# Patient Record
Sex: Female | Born: 1955 | Race: Black or African American | Hispanic: No | Marital: Married | Smoking: Never smoker
Health system: Southern US, Community
[De-identification: ages and names within clinical notes are randomized; demographics above are authoritative.]

## PROBLEM LIST (undated history)

## (undated) DIAGNOSIS — E7800 Pure hypercholesterolemia, unspecified: Secondary | ICD-10-CM

## (undated) DIAGNOSIS — R011 Cardiac murmur, unspecified: Secondary | ICD-10-CM

## (undated) DIAGNOSIS — K219 Gastro-esophageal reflux disease without esophagitis: Secondary | ICD-10-CM

## (undated) DIAGNOSIS — I499 Cardiac arrhythmia, unspecified: Secondary | ICD-10-CM

## (undated) DIAGNOSIS — I10 Essential (primary) hypertension: Secondary | ICD-10-CM

## (undated) DIAGNOSIS — Z8669 Personal history of other diseases of the nervous system and sense organs: Secondary | ICD-10-CM

## (undated) DIAGNOSIS — M549 Dorsalgia, unspecified: Secondary | ICD-10-CM

---

## 1991-07-24 HISTORY — PX: ABDOMINAL HYSTERECTOMY: SHX81

## 2001-02-24 ENCOUNTER — Encounter: Payer: Self-pay | Admitting: Emergency Medicine

## 2001-02-24 ENCOUNTER — Emergency Department (HOSPITAL_COMMUNITY): Admission: EM | Admit: 2001-02-24 | Discharge: 2001-02-24 | Payer: Self-pay | Admitting: Emergency Medicine

## 2004-05-07 ENCOUNTER — Encounter: Admission: RE | Admit: 2004-05-07 | Discharge: 2004-05-07 | Payer: Self-pay | Admitting: Internal Medicine

## 2004-11-14 ENCOUNTER — Emergency Department (HOSPITAL_COMMUNITY): Admission: EM | Admit: 2004-11-14 | Discharge: 2004-11-14 | Payer: Self-pay | Admitting: Emergency Medicine

## 2006-08-23 ENCOUNTER — Encounter: Admission: RE | Admit: 2006-08-23 | Discharge: 2006-08-23 | Payer: Self-pay | Admitting: Sports Medicine

## 2007-07-10 ENCOUNTER — Emergency Department (HOSPITAL_COMMUNITY): Admission: EM | Admit: 2007-07-10 | Discharge: 2007-07-10 | Payer: Self-pay | Admitting: Emergency Medicine

## 2007-07-24 HISTORY — PX: BACK SURGERY: SHX140

## 2007-10-30 ENCOUNTER — Ambulatory Visit (HOSPITAL_COMMUNITY): Admission: RE | Admit: 2007-10-30 | Discharge: 2007-10-31 | Payer: Self-pay | Admitting: Specialist

## 2010-08-14 ENCOUNTER — Encounter: Payer: Self-pay | Admitting: Specialist

## 2010-12-05 NOTE — Op Note (Signed)
Martha Sherman, Martha Sherman             ACCOUNT NO.:  [id number]   MEDICAL RECORD NO.:  [id number]          PATIENT TYPE:  AMB   LOCATION:  DAY                          FACILITY:  [facility]   PHYSICIAN:  Jene Every, M.D.    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   POSTOPERATIVE DIAGNOSES:  Spinal stenosis, herniated nucleus pulposus at  L4-5, L5-S1 less, left.   PROCEDURE PERFORMED:  1. Lateral recess decompression, microdiskectomy 4-5, foraminotomy,      L5.  2. Lateral recess decompression, microdiskectomy and foraminotomy, L5-      S1, left.   ANESTHESIA:  General.   ASSISTANT:  Roma Schanz, P.A.   BRIEF HISTORY AND INDICATION:  This is a 55 year old female with  refractory left lower extremity radicular pain secondary to  predominantly an HNP at 4-5, though she had S1 symptoms as well and  there was lateral recess stenosis and central disk protrusion at 5-1.  EHL weakness was noted.  She had refractory to conservative treatment  including rest, activity modification, corticosteroid injections, anti-  inflammatories, etc.  She was indicated for decompression at 4-5 and at  5-1.  The risks and benefits were discussed including bleeding,  infection, damage to vascular structures, CSF leakage, epidural  fibrosis, adjacent segment disease, need for fusion in the future,  anesthetic complications etc.   The patient was placed in supine position.  After induction of adequate  general anesthesia, 2 grams of Kefzol, she was placed prone on the  Fossil frame.  All bony prominences were well-padded.  Lumbar region  was prepped and draped in the usual sterile fashion.  Two 18 gauge  spinal needles were utilized to localize 4-5 and 5-1 interspace.  This  was confirmed with x-ray.  Incision was made  from spinous process of 4  to the below to the tip of the top of S1.  Subcutaneous tissue was  dissected.  Electrocautery was utilized to achieve hemostasis.  Dorsolumbar fascia was identified and divided in line with the skin  incision.  The paraspinous muscle elevated from the lamina of 4-5 and 5-  1.  The McCullough retractor was placed.  A Penfield 4 in the  interlaminar space confirming 4-5 and 5-1 by x-ray.  Hemilaminotomy of  the caudad edge of 5 was performed with a Leksell rongeur, as well as  the caudad edge of 4.  We then detached the cephalad edge of ligamentum  at 4-5 with the 2 mm Kerrison, preserving the pars.  Straight curette  was utilized to detach ligamentum flavum from the cephalad edge of five.  Ligamentum flavum was removed from the lateral recess.  I preserved a  central flap to be placed over the dural sac following decompression to  limit fibrosis.  Significant stenosis in the lateral recess noted, as  well as a vascular venous plexus.  I performed a foraminotomy of 5,  identified the 5 root and gently mobilized  it medially, exposing a focal  HNP at the disk space slightly migrating caudad.  Annulotomy was  performed and a copious portion of disk material was removed from the  disk space with a straight and upbiting pituitary with multiple passes.  This further mobilized with an Epstein curette.  Again, multiple passes  were performed to ensure full diskectomy of herniated material.  Lateral  recesses had been decompressed in the medial 4 of the pedicle and  bipolar electrocautery was utilized to achieve hemostasis.  Following  this excursion of the nerve root was at least a centimeter medial to the  pedicle without difficulty.  Hockey stick probe passed freely out the  foramen of 4 and 5.  This disk space was copiously irrigated with  Angiocath irrigation and antibiotics.  The ligamentum flavum leaflet was  allowed to flap back over the thecal sac.  Its thickness  was debrided  previously.  In a similar fashion we then detached ligamentum flavum  from the cephalad edge of S1.  There was stenosis noted in the lateral  recess, performed foraminotomy of S1, gently mobilized the thecal sac  and the S1 nerve root medially and decompressed the lateral recess to  the medial border of pedicle, focal HNP was noted.  Annulotomy was  performed.  Disk material was removed from the disk space with straight  and upbiting pituitary, decompressing the lateral recess.  Following  this there was good mobility of the S1 nerve root at least a centimeter  medial to the pedicle without difficulty.  Disk space was copiously  irrigated with antibiotic irrigation.  Inspection revealed no evidence  of CSF leakage or active bleeding.  Thrombin-soaked Gelfoam was placed  in laminotomy defect.  I removed the McCullough retractor.  Paraspinous  muscles were inspected with no evidence of active bleeding.  They were  copiously irrigated and closed the fascia with  #1 Vicryl interrupted  figure-of-eight sutures.  Subcutaneous tissue reapproximated with 2-0  Vicryl simple sutures.  Skin was reapproximate with 4-0 subcuticular  Prolene.  Wound reinforced with Steri-Strips.  Sterile dressing applied.  The patient was placed supine on the hospital bed, extubated without  difficulty and transported to the recovery room in satisfactory  condition.   The patient tolerated the procedure well with no complications.      Jene Every, M.D.  Electronically Signed     JB/MEDQ  D:  10/30/2007  T:  10/30/2007  Job:  [id number]

## 2011-02-01 ENCOUNTER — Emergency Department (HOSPITAL_BASED_OUTPATIENT_CLINIC_OR_DEPARTMENT_OTHER)
Admission: EM | Admit: 2011-02-01 | Discharge: 2011-02-01 | Disposition: A | Discharge status: Home / Self Care | Payer: 59 | Attending: Emergency Medicine | Admitting: Emergency Medicine

## 2011-02-01 ENCOUNTER — Encounter: Payer: Self-pay | Admitting: *Deleted

## 2011-02-01 DIAGNOSIS — I10 Essential (primary) hypertension: Secondary | ICD-10-CM

## 2011-02-01 DIAGNOSIS — E119 Type 2 diabetes mellitus without complications: Secondary | ICD-10-CM | POA: Insufficient documentation

## 2011-02-01 DIAGNOSIS — R51 Headache: Secondary | ICD-10-CM

## 2011-02-01 HISTORY — DX: Essential (primary) hypertension: I10

## 2011-02-01 MED ORDER — DEXAMETHASONE SODIUM PHOSPHATE 10 MG/ML IJ SOLN
10.0000 mg | Freq: Once | INTRAMUSCULAR | Status: AC
  Administered 2011-02-01: 10 mg via INTRAVENOUS
  Filled 2011-02-01: qty 1

## 2011-02-01 MED ORDER — METOCLOPRAMIDE HCL 5 MG/ML IJ SOLN
10.0000 mg | Freq: Once | INTRAMUSCULAR | Status: AC
  Administered 2011-02-01: 10 mg via INTRAVENOUS
  Filled 2011-02-01: qty 2

## 2011-02-01 MED ORDER — SODIUM CHLORIDE 0.9 % IV SOLN
Freq: Once | INTRAVENOUS | Status: AC
  Administered 2011-02-01: 21:00:00 via INTRAVENOUS

## 2011-02-01 NOTE — ED Provider Notes (Signed)
History     Chief Complaint  Patient presents with  . Hypertension   HPI Pt c/o diffuse headache w/ associated photophobia and nausea x approx 6 hours.  No other neurologic complaints.  No fever.  No head trauma. Checked BP and noted it to be elevated w/ systolic in 180s.  Pt has been compliant w/ losartan. Has not had a similar headache in approx 1 year.    Past Medical History  Diagnosis Date  . Hypertension   . Diabetes mellitus     Past Surgical History  Procedure Date  . Abdominal hysterectomy   . Back surgery     History reviewed. No pertinent family history.  History  Substance Use Topics  . Smoking status: Never Smoker   . Smokeless tobacco: Not on file  . Alcohol Use: No    OB History    Grav Para Term Preterm Abortions TAB SAB Ect Mult Living                  Review of Systems  All other systems reviewed and are negative.    Physical Exam  BP 182/88  Pulse 81  Temp(Src) 98.2 F (36.8 C) (Oral)  Resp 16  Wt 178 lb (80.74 kg)  SpO2 100%  Physical Exam  Nursing note and vitals reviewed. Constitutional: She is oriented to person, place, and time. She appears well-developed and well-nourished.       Uncomfortable appearing  HENT:  Head: Normocephalic and atraumatic.  Eyes:       Normal appearance  Neck: Normal range of motion.  Cardiovascular: Normal rate and regular rhythm.   Pulmonary/Chest: Effort normal and breath sounds normal.  Musculoskeletal: Normal range of motion.  Neurological: She is alert and oriented to person, place, and time. She has normal reflexes. No cranial nerve deficit or sensory deficit. Coordination and gait normal.       5/5 and equal upper and lower extremity strength.  No past pointing.  No meningeal signs.  Skin: Skin is warm and dry. No rash noted.  Psychiatric: She has a normal mood and affect. Her behavior is normal.    ED Course  Procedures  MDM Pt presents w/ diffuse, non-traumatic headache and HTN.   Compliant w/ anti-hypertensives.  Afebrile and no focal neuro deficits or meningeal signs on exam.  BP 182/88.   Treated pain w/ IV fluids, decadron and reglan and headache has resolved.  BP rechecked and 169 systolic.  Pt advised to monitor BP at home over the next few days and f/u with PCP.  Return precautions discussed.       Arie Sabina [place], [place] 02/01/11 2138

## 2011-02-01 NOTE — ED Notes (Signed)
Pt c/o h/a x 6 hrs also HTN 183/110 at home

## 2011-02-01 NOTE — ED Notes (Signed)
Pt reports relief of HA

## 2011-04-17 LAB — CBC
HCT: 32 — ABNORMAL LOW
Hemoglobin: 12.6
MCHC: 33.4
MCHC: 34.5
MCV: 80.5
MCV: 80.9
Platelets: 229
RBC: 4.66

## 2011-04-17 LAB — URINALYSIS, ROUTINE W REFLEX MICROSCOPIC
Bilirubin Urine: NEGATIVE
Glucose, UA: NEGATIVE
Hgb urine dipstick: NEGATIVE
Ketones, ur: NEGATIVE
Protein, ur: NEGATIVE

## 2011-04-17 LAB — COMPREHENSIVE METABOLIC PANEL
ALT: 16
CO2: 31
Calcium: 9.5
Creatinine, Ser: 0.67
GFR calc non Af Amer: >60
Glucose, Bld: 170 — ABNORMAL HIGH
Sodium: 140

## 2011-04-17 LAB — DIFFERENTIAL
Basophils Absolute: 0
Eosinophils Absolute: 0.1
Lymphs Abs: 1.4
Neutrophils Relative %: 63

## 2011-04-17 LAB — BASIC METABOLIC PANEL
BUN: 7
BUN: 9
CO2: 24
CO2: 30
Chloride: 103
Chloride: 104
Creatinine, Ser: 0.64
Creatinine, Ser: 0.66
Glucose, Bld: 204 — ABNORMAL HIGH
Potassium: 3.7

## 2011-04-17 LAB — CLOSTRIDIUM DIFFICILE EIA

## 2011-04-17 LAB — PROTIME-INR
INR: 0.9
Prothrombin Time: 12.8

## 2011-04-27 LAB — CBC
HCT: 39.4
MCHC: 32.8
MCV: 81.5
Platelets: 295
RBC: 4.83

## 2011-04-27 LAB — I-STAT 8, (EC8 V) (CONVERTED LAB)
BUN: 6
Bicarbonate: 28.7 — ABNORMAL HIGH
Glucose, Bld: 158 — ABNORMAL HIGH
Hemoglobin: 14.6
Sodium: 139
TCO2: 30
pCO2, Ven: 46.5

## 2011-04-27 LAB — DIFFERENTIAL
Basophils Relative: 1
Eosinophils Absolute: 0.1 — ABNORMAL LOW
Lymphs Abs: 1.2
Monocytes Absolute: 0.3
Neutrophils Relative %: 58

## 2011-04-27 LAB — POCT CARDIAC MARKERS
CKMB, poc: <1 — ABNORMAL LOW
Myoglobin, poc: 64.6
Operator id: 198171
Troponin i, poc: <0.05

## 2011-04-27 LAB — POCT I-STAT CREATININE: Creatinine, Ser: 0.8

## 2011-06-01 NOTE — ED Provider Notes (Signed)
Evaluation and management procedures were performed by the PA/NP under my supervision/collaboration.    Felisa Bonier, MD 06/01/11 1440

## 2012-02-13 ENCOUNTER — Emergency Department (HOSPITAL_BASED_OUTPATIENT_CLINIC_OR_DEPARTMENT_OTHER): Payer: Medicare Other

## 2012-02-13 ENCOUNTER — Emergency Department (HOSPITAL_BASED_OUTPATIENT_CLINIC_OR_DEPARTMENT_OTHER)
Admission: EM | Admit: 2012-02-13 | Discharge: 2012-02-13 | Disposition: A | Discharge status: Home / Self Care | Payer: Medicare Other | Attending: Emergency Medicine | Admitting: Emergency Medicine

## 2012-02-13 ENCOUNTER — Encounter (HOSPITAL_BASED_OUTPATIENT_CLINIC_OR_DEPARTMENT_OTHER): Payer: Self-pay | Admitting: *Deleted

## 2012-02-13 DIAGNOSIS — R51 Headache: Secondary | ICD-10-CM | POA: Insufficient documentation

## 2012-02-13 DIAGNOSIS — R209 Unspecified disturbances of skin sensation: Secondary | ICD-10-CM | POA: Insufficient documentation

## 2012-02-13 DIAGNOSIS — E119 Type 2 diabetes mellitus without complications: Secondary | ICD-10-CM | POA: Insufficient documentation

## 2012-02-13 DIAGNOSIS — Z79899 Other long term (current) drug therapy: Secondary | ICD-10-CM | POA: Insufficient documentation

## 2012-02-13 DIAGNOSIS — I10 Essential (primary) hypertension: Secondary | ICD-10-CM | POA: Insufficient documentation

## 2012-02-13 HISTORY — DX: Cardiac murmur, unspecified: R01.1

## 2012-02-13 HISTORY — DX: Gastro-esophageal reflux disease without esophagitis: K21.9

## 2012-02-13 HISTORY — DX: Cardiac arrhythmia, unspecified: I49.9

## 2012-02-13 LAB — BASIC METABOLIC PANEL
CO2: 30 mEq/L (ref 19–32)
Calcium: 9.6 mg/dL (ref 8.4–10.5)
Creatinine, Ser: 0.7 mg/dL (ref 0.50–1.10)
GFR calc Af Amer: >90 mL/min (ref >90)
GFR calc non Af Amer: >90 mL/min (ref >90)
Sodium: 138 mEq/L (ref 135–145)

## 2012-02-13 LAB — CBC
Hemoglobin: 12.5 g/dL (ref 12.0–15.0)
MCH: 26.5 pg (ref 26.0–34.0)
MCV: 78.8 fL (ref 78.0–100.0)
RBC: 4.72 MIL/uL (ref 3.87–5.11)

## 2012-02-13 MED ORDER — LABETALOL HCL 5 MG/ML IV SOLN
20.0000 mg | Freq: Once | INTRAVENOUS | Status: DC
  Filled 2012-02-13: qty 4

## 2012-02-13 MED ORDER — ACETAMINOPHEN 325 MG PO TABS
650.0000 mg | ORAL_TABLET | Freq: Once | ORAL | Status: AC
  Administered 2012-02-13: 650 mg via ORAL
  Filled 2012-02-13: qty 2

## 2012-02-13 MED ORDER — SODIUM CHLORIDE 0.9 % IV SOLN: INTRAVENOUS | Status: DC

## 2012-02-13 NOTE — ED Notes (Signed)
Patient states she has a headache that has woke her up off and on through out the night.  States she took her bp this morning and it was 195/107.  History of HTN.  States she has had an intermittent nose bleed and her lips and right foot are numb.

## 2012-02-13 NOTE — ED Provider Notes (Signed)
History     CSN: [id number]  Arrival date & time 02/13/12  1308   First MD Initiated Contact with Patient 02/13/12 [phone]      Chief Complaint  Patient presents with  . Headache    (Consider location/radiation/quality/duration/timing/severity/associated sxs/prior treatment) HPI  Patient complaining of headache off and on through night.  She states she get headaches when bp up.  Pain more on left side. No numbness or tingling.  Patient states taking hyzaar as prescribed and takes one pill in a.m.  Took dose at 0730.  No focal weakness or visual problems.  Patient has had headaches before with elevated bp.  Patient states pharmacy sometimes changes her pills ( one generic vs another manufacterer) then she sometimes has problems.  Last change was a month ago.     Past Medical History  Diagnosis Date  . Hypertension   . Diabetes mellitus   . Murmur   . Irregular heart beats     occassional  . Acid reflux     Past Surgical History  Procedure Date  . Abdominal hysterectomy   . Back surgery     No family history on file.  History  Substance Use Topics  . Smoking status: Never Smoker   . Smokeless tobacco: Not on file  . Alcohol Use: No    OB History    Grav Para Term Preterm Abortions TAB SAB Ect Mult Living                  Review of Systems  All other systems reviewed and are negative.    Allergies  Review of patient's allergies indicates no known allergies.  Home Medications   Current Outpatient Rx  Name Route Sig Dispense Refill  . ACETAMINOPHEN 500 MG PO TABS Oral Take 500 mg by mouth as needed. For pain     . HYDROCODONE-ACETAMINOPHEN 5-325 MG PO TABS Oral Take 0.5 tablets by mouth as needed. For pain     . LOSARTAN POTASSIUM-HCTZ 100-25 MG PO TABS Oral Take 1 tablet by mouth daily.      Marland Kitchen METFORMIN HCL ER (MOD) 500 MG PO TB24 Oral Take 500 mg by mouth 2 (two) times daily with a meal.        BP 190/98  Pulse 72  Temp 98.2 F (36.8 C) (Oral)  Resp  20  Ht 5\' 8"  (1.727 m)  Wt 177 lb (80.287 kg)  BMI 26.91 kg/m2  SpO2 100%  Physical Exam  Nursing note and vitals reviewed. Constitutional: She appears well-developed and well-nourished.  HENT:  Head: Normocephalic and atraumatic.  Eyes: Conjunctivae and EOM are normal. Pupils are equal, round, and reactive to light.  Neck: Normal range of motion. Neck supple.  Cardiovascular: Normal rate, regular rhythm, normal heart sounds and intact distal pulses.   Pulmonary/Chest: Effort normal and breath sounds normal.  Abdominal: Soft. Bowel sounds are normal.  Musculoskeletal: Normal range of motion.  Neurological: She is alert.  Skin: Skin is warm and dry.  Psychiatric: She has a normal mood and affect. Thought content normal.    ED Course  Procedures (including critical care time)  Labs Reviewed - No data to display No results found.   No diagnosis found.    MDM  No information on file. Ct Head Wo Contrast  02/13/2012  *RADIOLOGY REPORT*  Clinical Data: Headaches.  Hypertension.  Lipid numbness.  CT HEAD WITHOUT CONTRAST  Technique:  Contiguous axial images were obtained from the base of the skull  through the vertex without contrast.  Comparison: None.  Findings: No acute intracranial abnormality is present. Specifically, there is no evidence for acute infarct, hemorrhage, mass, hydrocephalus, or extra-axial fluid collection.  The paranasal sinuses and mastoid air cells are clear.  The globes and orbits are intact.  The osseous skull is intact.  IMPRESSION: Negative CT of the head.  Original Report Authenticated By: Jamesetta Orleans. MATTERN, M.D.  Results for orders placed during the hospital encounter of 02/13/12  BASIC METABOLIC PANEL      Component Value Range   Sodium 138  135 - 145 mEq/L   Potassium 3.3 (*) 3.5 - 5.1 mEq/L   Chloride 99  96 - 112 mEq/L   CO2 30  19 - 32 mEq/L   Glucose, Bld 206 (*) 70 - 99 mg/dL   BUN 11  6 - 23 mg/dL   Creatinine, Ser 1.61  0.50 - 1.10  mg/dL   Calcium 9.6  8.4 - 09.6 mg/dL   GFR calc non Af Amer >90  >90 mL/min   GFR calc Af Amer >90  >90 mL/min  CBC      Component Value Range   WBC 5.2  4.0 - 10.5 K/uL   RBC 4.72  3.87 - 5.11 MIL/uL   Hemoglobin 12.5  12.0 - 15.0 g/dL   HCT 04.5  40.9 - 81.1 %   MCV 78.8  78.0 - 100.0 fL   MCH 26.5  26.0 - 34.0 pg   MCHC 33.6  30.0 - 36.0 g/dL   RDW 91.4  78.2 - 95.6 %   Platelets 294  150 - 400 K/uL         Date: 02/13/2012  Rate: 59  Rhythm: normal sinus rhythm  QRS Axis: normal  Intervals: normal  ST/T Wave abnormalities: normal  Conduction Disutrbances: none  Narrative Interpretation: unremarkable    Patient with improved headache ans sbp decreased to 178.  Discussed withpatient and she will follow up her pmd for recheck bs possible medication adjustment of bp meds.   Hilario Quarry, MD 02/13/12 1040

## 2014-01-05 ENCOUNTER — Encounter (HOSPITAL_BASED_OUTPATIENT_CLINIC_OR_DEPARTMENT_OTHER): Payer: Self-pay | Admitting: Emergency Medicine

## 2014-01-05 ENCOUNTER — Emergency Department (HOSPITAL_BASED_OUTPATIENT_CLINIC_OR_DEPARTMENT_OTHER): Payer: Medicare Other

## 2014-01-05 ENCOUNTER — Emergency Department (HOSPITAL_BASED_OUTPATIENT_CLINIC_OR_DEPARTMENT_OTHER)
Admission: EM | Admit: 2014-01-05 | Discharge: 2014-01-06 | Disposition: A | Discharge status: Home / Self Care | Payer: Medicare Other | Attending: Emergency Medicine | Admitting: Emergency Medicine

## 2014-01-05 DIAGNOSIS — K219 Gastro-esophageal reflux disease without esophagitis: Secondary | ICD-10-CM | POA: Insufficient documentation

## 2014-01-05 DIAGNOSIS — Z79899 Other long term (current) drug therapy: Secondary | ICD-10-CM | POA: Insufficient documentation

## 2014-01-05 DIAGNOSIS — Z862 Personal history of diseases of the blood and blood-forming organs and certain disorders involving the immune mechanism: Secondary | ICD-10-CM | POA: Insufficient documentation

## 2014-01-05 DIAGNOSIS — R0789 Other chest pain: Secondary | ICD-10-CM | POA: Insufficient documentation

## 2014-01-05 DIAGNOSIS — R011 Cardiac murmur, unspecified: Secondary | ICD-10-CM | POA: Insufficient documentation

## 2014-01-05 DIAGNOSIS — E119 Type 2 diabetes mellitus without complications: Secondary | ICD-10-CM | POA: Insufficient documentation

## 2014-01-05 DIAGNOSIS — Z8639 Personal history of other endocrine, nutritional and metabolic disease: Secondary | ICD-10-CM | POA: Insufficient documentation

## 2014-01-05 DIAGNOSIS — I10 Essential (primary) hypertension: Secondary | ICD-10-CM | POA: Insufficient documentation

## 2014-01-05 HISTORY — DX: Pure hypercholesterolemia, unspecified: E78.00

## 2014-01-05 LAB — BASIC METABOLIC PANEL
BUN: 10 mg/dL (ref 6–23)
CHLORIDE: 102 meq/L (ref 96–112)
CO2: 26 mEq/L (ref 19–32)
Calcium: 9.8 mg/dL (ref 8.4–10.5)
Creatinine, Ser: 0.7 mg/dL (ref 0.50–1.10)
GLUCOSE: 169 mg/dL — AB (ref 70–99)
POTASSIUM: 3.6 meq/L — AB (ref 3.7–5.3)
SODIUM: 142 meq/L (ref 137–147)

## 2014-01-05 LAB — CBC WITH DIFFERENTIAL/PLATELET
BASOS PCT: 0 % (ref 0–1)
Basophils Absolute: 0 10*3/uL (ref 0.0–0.1)
EOS ABS: 0.1 10*3/uL (ref 0.0–0.7)
Eosinophils Relative: 2 % (ref 0–5)
HCT: 37.7 % (ref 36.0–46.0)
HEMOGLOBIN: 12.6 g/dL (ref 12.0–15.0)
LYMPHS ABS: 3 10*3/uL (ref 0.7–4.0)
Lymphocytes Relative: 44 % (ref 12–46)
MCH: 27.5 pg (ref 26.0–34.0)
MCHC: 33.4 g/dL (ref 30.0–36.0)
MCV: 82.1 fL (ref 78.0–100.0)
MONOS PCT: 9 % (ref 3–12)
Monocytes Absolute: 0.6 10*3/uL (ref 0.1–1.0)
NEUTROS ABS: 3.1 10*3/uL (ref 1.7–7.7)
NEUTROS PCT: 45 % (ref 43–77)
PLATELETS: 245 10*3/uL (ref 150–400)
RBC: 4.59 MIL/uL (ref 3.87–5.11)
RDW: 14.3 % (ref 11.5–15.5)
WBC: 6.8 10*3/uL (ref 4.0–10.5)

## 2014-01-05 LAB — TROPONIN I: Troponin I: <0.3 ng/mL (ref <0.30)

## 2014-01-05 NOTE — ED Provider Notes (Signed)
CSN: [id number]     Arrival date & time 01/05/14  2217 History  This chart was scribed for Hanley SeamenJohn L Molpus, MD by Ardelia Memsylan Malpass, ED Scribe. This patient was seen in room MH01/MH01 and the patient's care was started at 11:10 PM.   Chief Complaint  Patient presents with  . Chest Pain    The history is provided by the patient. No language interpreter was used.    HPI Comments: Martha Sherman is a 58 y.o. female with a history of HTN, DM, high cholesterol and heart murmur who presents to the Emergency Department complaining of intermittent, sharp substernal chest pain that began around 9:30 PM tonight. She states that her pain does not radiate. She states that there are no inciting factors that bring about her pain. She states that her pain is not modified by breathing. She states that she has taken Aspirin with some relief of her pain. She states that the pain occurs in 15-20 second episodes, which occur about every 5 minutes. She states that she is not having any chest pain currently. She states that she has mild SOB while she is having chest pain. She denies any associated diaphoresis or nausea.   Past Medical History  Diagnosis Date  . Hypertension   . Diabetes mellitus   . Murmur   . Irregular heart beats     occassional  . Acid reflux   . High cholesterol    Past Surgical History  Procedure Laterality Date  . Abdominal hysterectomy    . Back surgery     No family history on file. History  Substance Use Topics  . Smoking status: Never Smoker   . Smokeless tobacco: Not on file  . Alcohol Use: No   OB History   Grav Para Term Preterm Abortions TAB SAB Ect Mult Living                 Review of Systems A complete 10 system review of systems was obtained and all systems are negative except as noted in the HPI and PMH.   Allergies  Review of patient's allergies indicates no known allergies.  Home Medications   Prior to Admission medications   Medication Sig Start Date End  Date Taking? Authorizing Provider  OMEPRAZOLE PO Take by mouth.   Yes Historical Provider, MD  acetaminophen (TYLENOL) 500 MG tablet Take 500 mg by mouth as needed. For pain     Historical Provider, MD  HYDROcodone-acetaminophen (NORCO) 5-325 MG per tablet Take 0.5 tablets by mouth as needed. For pain     Historical Provider, MD  losartan-hydrochlorothiazide (HYZAAR) 100-25 MG per tablet Take 1 tablet by mouth daily.      Historical Provider, MD  metFORMIN (GLUMETZA) 500 MG (MOD) 24 hr tablet Take 500 mg by mouth 2 (two) times daily with a meal.      Historical Provider, MD   Triage Vitals: BP 222/98  Pulse 89  Temp(Src) 98.3 F (36.8 C) (Oral)  Resp 20  Ht 5\' 7"  (1.702 m)  Wt 180 lb (81.647 kg)  BMI 28.19 kg/m2  SpO2 100%  Physical Exam  Nursing note and vitals reviewed. General: Well-developed, well-nourished female in no acute distress; appearance consistent with age of record HENT: normocephalic; atraumatic Eyes: pupils equal, round and reactive to light; extraocular muscles intact Neck: supple Heart: regular rate and rhythm; no murmur heard Lungs: clear to auscultation bilaterally Abdomen: soft; nondistended; nontender; no masses or hepatosplenomegaly; bowel sounds present Extremities: No deformity; full  range of motion; pulses normal; no edema Neurologic: Awake, alert and oriented; motor function intact in all extremities and symmetric; no facial droop Skin: Warm and dry Psychiatric: Normal mood and affect  ED Course  Procedures (including critical care time)  DIAGNOSTIC STUDIES: Oxygen Saturation is 100% on RA, normal by my interpretation.    COORDINATION OF CARE: 11:15 PM- Pt advised of plan for treatment and pt agrees.   EKG Interpretation   Date/Time:  Tuesday January 05 2014 22:25:37 EDT Ventricular Rate:  80 PR Interval:  134 QRS Duration: 100 QT Interval:  374 QTC Calculation: 431 R Axis:   40 Text Interpretation:  Normal sinus rhythm Incomplete right  bundle branch  block , not present on prior Nonspecific ST abnormality Abnormal ECG  Confirmed by HORTON  MD, COURTNEY ([id number]) on 01/05/2014 10:28:33 PM      MDM   Nursing notes and vitals signs, including pulse oximetry, reviewed.  Summary of this visit's results, reviewed by myself:  Labs:  Results for orders placed during the hospital encounter of 01/05/14 (from the past 24 hour(s))  CBC WITH DIFFERENTIAL     Status: None   Collection Time    01/05/14 10:25 PM      Result Value Ref Range   WBC 6.8  4.0 - 10.5 K/uL   RBC 4.59  3.87 - 5.11 MIL/uL   Hemoglobin 12.6  12.0 - 15.0 g/dL   HCT 60.437.7  54.036.0 - 98.146.0 %   MCV 82.1  78.0 - 100.0 fL   MCH 27.5  26.0 - 34.0 pg   MCHC 33.4  30.0 - 36.0 g/dL   RDW 19.114.3  47.811.5 - 29.515.5 %   Platelets 245  150 - 400 K/uL   Neutrophils Relative % 45  43 - 77 %   Neutro Abs 3.1  1.7 - 7.7 K/uL   Lymphocytes Relative 44  12 - 46 %   Lymphs Abs 3.0  0.7 - 4.0 K/uL   Monocytes Relative 9  3 - 12 %   Monocytes Absolute 0.6  0.1 - 1.0 K/uL   Eosinophils Relative 2  0 - 5 %   Eosinophils Absolute 0.1  0.0 - 0.7 K/uL   Basophils Relative 0  0 - 1 %   Basophils Absolute 0.0  0.0 - 0.1 K/uL  BASIC METABOLIC PANEL     Status: Abnormal   Collection Time    01/05/14 10:25 PM      Result Value Ref Range   Sodium 142  137 - 147 mEq/L   Potassium 3.6 (*) 3.7 - 5.3 mEq/L   Chloride 102  96 - 112 mEq/L   CO2 26  19 - 32 mEq/L   Glucose, Bld 169 (*) 70 - 99 mg/dL   BUN 10  6 - 23 mg/dL   Creatinine, Ser 6.210.70  0.50 - 1.10 mg/dL   Calcium 9.8  8.4 - 30.810.5 mg/dL   GFR calc non Af Amer >90  >90 mL/min   GFR calc Af Amer >90  >90 mL/min  TROPONIN I     Status: None   Collection Time    01/05/14 10:25 PM      Result Value Ref Range   Troponin I <0.30  <0.30 ng/mL  TROPONIN I     Status: None   Collection Time    01/06/14 12:15 AM      Result Value Ref Range   Troponin I <0.30  <0.30 ng/mL  TROPONIN I  Status: None   Collection Time    01/06/14  1:30  AM      Result Value Ref Range   Troponin I <0.30  <0.30 ng/mL    Imaging Studies: Dg Chest 2 View  01/05/2014   CLINICAL DATA:  Mid chest pain.  EXAM: CHEST  2 VIEW  COMPARISON:  Chest radiograph from 07/10/2007  FINDINGS: The lungs are well-aerated. Pulmonary vascularity is at the upper limits of normal. There is no evidence of focal opacification, pleural effusion or pneumothorax.  The heart is borderline normal in size; the mediastinal contour is within normal limits. No acute osseous abnormalities are seen.  IMPRESSION: No acute cardiopulmonary process seen.   Electronically Signed   By: Roanna Raider M.D.   On: 01/05/2014 23:11   2:05 AM The patient has been asymptomatic since arrival. She had 3 negative troponins. Her symptoms are atypical for coronary pain. We will have her followup with her primary care physician.  I personally performed the services described in this documentation, which was scribed in my presence. The recorded information has been reviewed and is accurate.   Hanley Seamen, MD 01/06/14 [phone]

## 2014-01-05 NOTE — ED Notes (Addendum)
C/o intermittent CP x 3 days-denies pain at present-pt took ASA 325mg  PTA

## 2014-01-06 LAB — TROPONIN I

## 2014-01-06 NOTE — Discharge Instructions (Signed)

## 2014-02-19 ENCOUNTER — Encounter: Payer: Self-pay | Admitting: Podiatry

## 2014-02-19 ENCOUNTER — Ambulatory Visit (INDEPENDENT_AMBULATORY_CARE_PROVIDER_SITE_OTHER): Payer: Medicare Other | Admitting: Podiatry

## 2014-02-19 VITALS — BP 162/84 | HR 85 | Ht 67.0 in | Wt 177.0 lb

## 2014-02-19 DIAGNOSIS — M79609 Pain in unspecified limb: Secondary | ICD-10-CM | POA: Diagnosis not present

## 2014-02-19 DIAGNOSIS — M79671 Pain in right foot: Secondary | ICD-10-CM

## 2014-02-19 DIAGNOSIS — M722 Plantar fascial fibromatosis: Secondary | ICD-10-CM

## 2014-02-19 DIAGNOSIS — M21969 Unspecified acquired deformity of unspecified lower leg: Secondary | ICD-10-CM | POA: Diagnosis not present

## 2014-02-19 DIAGNOSIS — M21961 Unspecified acquired deformity of right lower leg: Secondary | ICD-10-CM

## 2014-02-19 NOTE — Patient Instructions (Signed)
Seen for right foot and heel pain.  Noted of weakened first Metatarsocuneiform joint right.  Night Splint dispensed x 1. Will use Cortisone injection as needed.  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  Mean time use laced up tennis shoes.

## 2014-02-19 NOTE — Progress Notes (Signed)
Pain in right heel x 63month. Had a fall one month ago and hurt right foot while running away from a lizard. Taken X-ray noted of no bone injury.  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 Has minor blurring vision. ENT ROS: negative Allergy and Immunology ROS: negative Breast ROS: Had done biopsy of breast and result was negative.  Respiratory ROS: no cough, shortness of breath, or wheezing Cardiovascular ROS: Has heart murmur. Dagnosed with mild pre hypertension in heart.  Gastrointestinal ROS: Takes medication for acid reflux. Genito-Urinary ROS: no dysuria, trouble voiding, or hematuria Musculoskeletal ROS: Herniated degenerative disc problem L4-5. Neurological ROS: Right sciatic nerve problem affecting right hip through the foot.  Dermatological ROS: negative.  Objective: Dermatologic: Normal findings. Vascular: All pedal pulses are palpable. No edema or erythema noted. Neurologic: All epicritic and tactile sensations grossly intact. 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 Enlarged tendon sheet Posterior tibial tendon behind medial malleolus.  Right heel pain in the morning.   Assessment: Plantar fasciitis right. Hypermobile first ray R>L.   Plan Clinical findings and available treatment options reviewed.  Night Splint dispensed x 1. Will use Cortisone injection as needed.  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  Mean time use laced up tennis shoes.

## 2014-12-25 ENCOUNTER — Emergency Department (HOSPITAL_BASED_OUTPATIENT_CLINIC_OR_DEPARTMENT_OTHER)
Admission: EM | Admit: 2014-12-25 | Discharge: 2014-12-25 | Disposition: A | Discharge status: Home / Self Care | Payer: Medicare Other | Attending: Emergency Medicine | Admitting: Emergency Medicine

## 2014-12-25 ENCOUNTER — Emergency Department (HOSPITAL_BASED_OUTPATIENT_CLINIC_OR_DEPARTMENT_OTHER): Payer: Medicare Other

## 2014-12-25 ENCOUNTER — Encounter (HOSPITAL_BASED_OUTPATIENT_CLINIC_OR_DEPARTMENT_OTHER): Payer: Self-pay | Admitting: *Deleted

## 2014-12-25 DIAGNOSIS — Z79899 Other long term (current) drug therapy: Secondary | ICD-10-CM | POA: Insufficient documentation

## 2014-12-25 DIAGNOSIS — R011 Cardiac murmur, unspecified: Secondary | ICD-10-CM | POA: Diagnosis not present

## 2014-12-25 DIAGNOSIS — J209 Acute bronchitis, unspecified: Secondary | ICD-10-CM | POA: Insufficient documentation

## 2014-12-25 DIAGNOSIS — J40 Bronchitis, not specified as acute or chronic: Secondary | ICD-10-CM

## 2014-12-25 DIAGNOSIS — E119 Type 2 diabetes mellitus without complications: Secondary | ICD-10-CM | POA: Insufficient documentation

## 2014-12-25 DIAGNOSIS — K219 Gastro-esophageal reflux disease without esophagitis: Secondary | ICD-10-CM | POA: Insufficient documentation

## 2014-12-25 DIAGNOSIS — I10 Essential (primary) hypertension: Secondary | ICD-10-CM | POA: Diagnosis not present

## 2014-12-25 DIAGNOSIS — R05 Cough: Secondary | ICD-10-CM | POA: Diagnosis present

## 2014-12-25 DIAGNOSIS — R0602 Shortness of breath: Secondary | ICD-10-CM

## 2014-12-25 HISTORY — DX: Dorsalgia, unspecified: M54.9

## 2014-12-25 LAB — COMPREHENSIVE METABOLIC PANEL
ALBUMIN: 3.8 g/dL (ref 3.5–5.0)
ALK PHOS: 69 U/L (ref 38–126)
ALT: 13 U/L — AB (ref 14–54)
AST: 17 U/L (ref 15–41)
Anion gap: 8 (ref 5–15)
BILIRUBIN TOTAL: 0.3 mg/dL (ref 0.3–1.2)
BUN: 8 mg/dL (ref 6–20)
CALCIUM: 8.9 mg/dL (ref 8.9–10.3)
CO2: 26 mmol/L (ref 22–32)
Chloride: 103 mmol/L (ref 101–111)
Creatinine, Ser: 0.65 mg/dL (ref 0.44–1.00)
GFR calc Af Amer: >60 mL/min (ref >60)
GFR calc non Af Amer: >60 mL/min (ref >60)
Glucose, Bld: 236 mg/dL — ABNORMAL HIGH (ref 65–99)
Potassium: 3.7 mmol/L (ref 3.5–5.1)
Sodium: 137 mmol/L (ref 135–145)
TOTAL PROTEIN: 7 g/dL (ref 6.5–8.1)

## 2014-12-25 LAB — BRAIN NATRIURETIC PEPTIDE: B NATRIURETIC PEPTIDE 5: 35.4 pg/mL (ref 0.0–100.0)

## 2014-12-25 LAB — CBC WITH DIFFERENTIAL/PLATELET
BASOS ABS: 0 10*3/uL (ref 0.0–0.1)
Basophils Relative: 0 % (ref 0–1)
EOS ABS: 0.1 10*3/uL (ref 0.0–0.7)
Eosinophils Relative: 2 % (ref 0–5)
HEMATOCRIT: 36.9 % (ref 36.0–46.0)
Hemoglobin: 11.7 g/dL — ABNORMAL LOW (ref 12.0–15.0)
LYMPHS ABS: 1.5 10*3/uL (ref 0.7–4.0)
LYMPHS PCT: 33 % (ref 12–46)
MCH: 25.9 pg — ABNORMAL LOW (ref 26.0–34.0)
MCHC: 31.7 g/dL (ref 30.0–36.0)
MCV: 81.6 fL (ref 78.0–100.0)
MONO ABS: 0.3 10*3/uL (ref 0.1–1.0)
Monocytes Relative: 8 % (ref 3–12)
NEUTROS PCT: 57 % (ref 43–77)
Neutro Abs: 2.5 10*3/uL (ref 1.7–7.7)
Platelets: 245 10*3/uL (ref 150–400)
RBC: 4.52 MIL/uL (ref 3.87–5.11)
RDW: 14.6 % (ref 11.5–15.5)
WBC: 4.5 10*3/uL (ref 4.0–10.5)

## 2014-12-25 LAB — TROPONIN I

## 2014-12-25 LAB — D-DIMER, QUANTITATIVE (NOT AT ARMC)

## 2014-12-25 MED ORDER — ALBUTEROL SULFATE HFA 108 (90 BASE) MCG/ACT IN AERS
2.0000 | INHALATION_SPRAY | RESPIRATORY_TRACT | Status: DC | PRN
  Administered 2014-12-25: 2 via RESPIRATORY_TRACT
  Filled 2014-12-25: qty 6.7

## 2014-12-25 MED ORDER — AEROCHAMBER PLUS FLO-VU MEDIUM MISC
1.0000 | Freq: Once | Status: AC
  Administered 2014-12-25: 1
  Filled 2014-12-25: qty 1

## 2014-12-25 MED ORDER — BENZONATATE 100 MG PO CAPS: 100.0000 mg | ORAL_CAPSULE | Freq: Three times a day (TID) | ORAL | Status: DC

## 2014-12-25 MED ORDER — AZITHROMYCIN 250 MG PO TABS: 250.0000 mg | ORAL_TABLET | Freq: Every day | ORAL | Status: DC

## 2014-12-25 NOTE — ED Notes (Signed)
Patient transported to X-ray 

## 2014-12-25 NOTE — ED Notes (Signed)
MD at bedside. 

## 2014-12-25 NOTE — ED Notes (Signed)
Pt reports SOB and cough x 2 weeks- eval by PCP for same

## 2014-12-25 NOTE — Discharge Instructions (Signed)

## 2014-12-25 NOTE — ED Notes (Signed)
D/c home with rx x 2 for tessalon and zithromax- albuterol inhaler given by RT with instructions for use

## 2014-12-25 NOTE — ED Provider Notes (Signed)
CSN: [id number]     Arrival date & time 12/25/14  0912 History   First MD Initiated Contact with Patient 12/25/14 0919     Chief Complaint  Patient presents with  . Cough  . Wheezing     (Consider location/radiation/quality/duration/timing/severity/associated sxs/prior Treatment) HPI Comments: Patient presents to the ER for evaluation of difficulty breathing. Patient reports that she started having cough and chest congestion 2 or 3 weeks ago. She saw her primary doctor and had an x-ray that was negative. She reports that she has continued to have shortness of breath. She reports that last night her breathing worsened, she felt like she was wheezing and had to sit up in order to breathe better. She has not been experiencing any chest pain associated with the symptoms. She has not had any fever.  Patient is a 59 y.o. female presenting with cough and wheezing.  Cough Associated symptoms: shortness of breath and wheezing   Associated symptoms: no chest pain   Wheezing Associated symptoms: cough and shortness of breath   Associated symptoms: no chest pain     Past Medical History  Diagnosis Date  . Hypertension   . Diabetes mellitus   . Murmur   . Irregular heart beats     occassional  . Acid reflux   . High cholesterol   . Back pain    Past Surgical History  Procedure Laterality Date  . Abdominal hysterectomy    . Back surgery     No family history on file. History  Substance Use Topics  . Smoking status: Never Smoker   . Smokeless tobacco: Never Used  . Alcohol Use: No   OB History    No data available     Review of Systems  Respiratory: Positive for cough, shortness of breath and wheezing.   Cardiovascular: Negative for chest pain and leg swelling.  All other systems reviewed and are negative.     Allergies  Review of patient's allergies indicates no known allergies.  Home Medications   Prior to Admission medications   Medication Sig Start Date End Date  Taking? Authorizing Provider  acetaminophen (TYLENOL) 500 MG tablet Take 500 mg by mouth as needed. For pain    Yes Historical Provider, MD  amLODipine (NORVASC) 10 MG tablet Take 10 mg by mouth daily.   Yes Historical Provider, MD  HYDROcodone-acetaminophen (NORCO) 5-325 MG per tablet Take 0.5 tablets by mouth as needed. For pain    Yes Historical Provider, MD  losartan-hydrochlorothiazide (HYZAAR) 100-25 MG per tablet Take 1 tablet by mouth daily.     Yes Historical Provider, MD  metFORMIN (GLUMETZA) 500 MG (MOD) 24 hr tablet Take 500 mg by mouth 2 (two) times daily with a meal.     Yes Historical Provider, MD  OMEPRAZOLE PO Take by mouth.   Yes Historical Provider, MD  azithromycin (ZITHROMAX) 250 MG tablet Take 1 tablet (250 mg total) by mouth daily. Take first 2 tablets together, then 1 every day until finished. 12/25/14   Gilda Crease, MD  benzonatate (TESSALON) 100 MG capsule Take 1 capsule (100 mg total) by mouth every 8 (eight) hours. 12/25/14   Gilda Crease, MD   BP 188/86 mmHg  Pulse 62  Temp(Src) 97.7 F (36.5 C) (Oral)  Resp 20  Ht  (1.702 m)  Wt 180 lb (81.647 kg)  BMI 28.19 kg/m2  SpO2 100% Physical Exam  Constitutional: She is oriented to person, place, and time. She appears well-developed and  well-nourished. No distress.  HENT:  Head: Normocephalic and atraumatic.  Right Ear: Hearing normal.  Left Ear: Hearing normal.  Nose: Nose normal.  Mouth/Throat: Oropharynx is clear and moist and mucous membranes are normal.  Eyes: Conjunctivae and EOM are normal. Pupils are equal, round, and reactive to light.  Neck: Normal range of motion. Neck supple.  Cardiovascular: Regular rhythm, S1 normal and S2 normal.  Exam reveals no gallop and no friction rub.   No murmur heard. Pulmonary/Chest: Effort normal and breath sounds normal. No respiratory distress. She exhibits no tenderness.  Abdominal: Soft. Normal appearance and bowel sounds are normal. There is no  hepatosplenomegaly. There is no tenderness. There is no rebound, no guarding, no tenderness at McBurney's point and negative Murphy's sign. No hernia.  Musculoskeletal: Normal range of motion.  Neurological: She is alert and oriented to person, place, and time. She has normal strength. No cranial nerve deficit or sensory deficit. Coordination normal. GCS eye subscore is 4. GCS verbal subscore is 5. GCS motor subscore is 6.  Skin: Skin is warm, dry and intact. No rash noted. No cyanosis.  Psychiatric: She has a normal mood and affect. Her speech is normal and behavior is normal. Thought content normal.  Nursing note and vitals reviewed.   ED Course  Procedures (including critical care time) Labs Review Labs Reviewed  CBC WITH DIFFERENTIAL/PLATELET - Abnormal; Notable for the following:    Hemoglobin 11.7 (*)    MCH 25.9 (*)    All other components within normal limits  COMPREHENSIVE METABOLIC PANEL - Abnormal; Notable for the following:    Glucose, Bld 236 (*)    ALT 13 (*)    All other components within normal limits  TROPONIN I  BRAIN NATRIURETIC PEPTIDE  D-DIMER, QUANTITATIVE (NOT AT [facility])    Imaging Review Dg Chest 2 View  12/25/2014   CLINICAL DATA:  Shortness of breath for 2 days.  Cough.  EXAM: CHEST - 2 VIEW  COMPARISON:  Two-view chest 01/05/2014.  FINDINGS: The heart size and mediastinal contours are within normal limits. Both lungs are clear. The visualized skeletal structures are unremarkable.  IMPRESSION: Negative two view chest x-ray.   Electronically Signed   By: Marin Robertshristopher  Mattern M.D.   On: 12/25/2014 09:50     EKG Interpretation   Date/Time:  Saturday December 25 2014 09:32:12 EDT Ventricular Rate:  62 PR Interval:  142 QRS Duration: 96 QT Interval:  416 QTC Calculation: 422 R Axis:   66 Text Interpretation:  Normal sinus rhythm Nonspecific ST abnormality  Abnormal ECG No significant change since last tracing Confirmed by Kylyn Sookram   MD, Kessler Solly [phone]([id number]) on  12/25/2014 9:52:25 AM      MDM   Final diagnoses:  Shortness of breath  Bronchitis    Patient presents with complaints of cough, chest congestion, shortness of breath. At times have been ongoing for more than 2 weeks. A chest x-ray was performed by her primary doctor but was negative. She describes worsening symptoms when she lies down at night. She has felt like she has had wheezing and chest congestion. Workup does not suggest congestive heart failure. Chest x-ray was clear. She has a normal d-dimer. Symptoms do not suggest PE. Patient having persistent symptoms of cough and chest congestion, will treat with Zithromax, Tessalon Perles, albuterol.    Gilda Creasehristopher J Ailynn Gow, MD 12/25/14 1128

## 2015-01-18 DIAGNOSIS — R05 Cough: Secondary | ICD-10-CM | POA: Insufficient documentation

## 2015-01-18 DIAGNOSIS — R0602 Shortness of breath: Secondary | ICD-10-CM | POA: Insufficient documentation

## 2015-01-18 DIAGNOSIS — R059 Cough, unspecified: Secondary | ICD-10-CM | POA: Insufficient documentation

## 2015-01-19 DIAGNOSIS — I10 Essential (primary) hypertension: Secondary | ICD-10-CM | POA: Insufficient documentation

## 2015-01-19 DIAGNOSIS — E119 Type 2 diabetes mellitus without complications: Secondary | ICD-10-CM | POA: Insufficient documentation

## 2015-01-19 DIAGNOSIS — M79601 Pain in right arm: Secondary | ICD-10-CM | POA: Insufficient documentation

## 2015-07-04 ENCOUNTER — Encounter: Payer: Self-pay | Admitting: Neurology

## 2015-07-04 ENCOUNTER — Ambulatory Visit (INDEPENDENT_AMBULATORY_CARE_PROVIDER_SITE_OTHER): Payer: Medicare Other | Admitting: Neurology

## 2015-07-04 VITALS — BP 162/92 | HR 84 | Ht 67.5 in | Wt 186.6 lb

## 2015-07-04 DIAGNOSIS — R42 Dizziness and giddiness: Secondary | ICD-10-CM | POA: Diagnosis not present

## 2015-07-04 DIAGNOSIS — G4484 Primary exertional headache: Secondary | ICD-10-CM

## 2015-07-04 DIAGNOSIS — R51 Headache: Secondary | ICD-10-CM

## 2015-07-04 DIAGNOSIS — G935 Compression of brain: Secondary | ICD-10-CM

## 2015-07-04 DIAGNOSIS — R29898 Other symptoms and signs involving the musculoskeletal system: Secondary | ICD-10-CM

## 2015-07-04 NOTE — Progress Notes (Signed)
[facility]    Provider:  Dr Lucia Gaskins Referring Provider: Karle Plumber, MD Primary Care Physician:  Caffie Damme, MD  CC:  Chiari Malformation  HPI:  Martha Sherman is a 59 y.o. female here as a referral from Dr. Kathrynn Sherman and Dr. Shon Sherman for neck pain, Chiari Malformation found on MRI of the cervical spine. She has neck pain, sharp and stabbing pain and stiffness in the cervical muscles and some cracking in the neck as she turns. Occasional headaches. She has bad headaches, a lot of pressure in the head. Lasts for a day and is responsive to OTC meds. The pressure is behind the eyes with light sensitivity and nausea once a month. The back of her neck feels pain with the headaches. She also has pain in the right arm and pain in the thumb with swelling. She wakes up with tingling in the right fingers. Weak grip in the right hand, difficulty opening jars. The pain is in the deltoids as well like a "jittering" inside.  No vision problems, no double vision or cranial neuropathies such as dysarthria or dysphagia, every now and ten she feels dizzy and the room spins and she feels off balance but that is episodic and then resolves, no other weakness.  She has increased neck pain on straining and coughing. Even if she laughs really hard she can experience neck pain.   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 Bladder he did subarachnoid space at the foramen magnum. 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 No congenital osseous abnormalities. Preference to prior lumbar MRI  demonstrates of the conus terminates properly. No intramedullary or intradural pathology cord visualized.  CBC with mild anemia  CMP with elevated glucose otherwise unremarkable  Review of Systems: Patient complains of symptoms per HPI as well as the following symptoms: No chest pain and no shortness of breath. Pertinent negatives per HPI. All others negative.   Social History   Social History  . Marital Status: Married    Spouse Name: [name]  . Number of Children: 1  . Years of Education: 14   Occupational History  . retired     Surveyor, [employer], [employer]   Social History Main Topics  . Smoking status: Never Smoker   . Smokeless tobacco: Never Used  . Alcohol Use: No  . Drug Use: No  . Sexual Activity: Yes    Birth Control/ Protection: Surgical   Other Topics Concern  . Not on file   Social History Narrative   Lives at home with husband   Caffeine use- sodas, 3 a day    Family History  Problem Relation Age of Onset  . Heart attack Mother   . Cancer Father   . Alzheimer's disease Father   . Hypertension Sister   . Hypertension Brother     Past Medical History  Diagnosis Date  . Hypertension   . Diabetes mellitus   . Murmur   . Irregular heart beats     occassional  . Acid reflux   . High cholesterol   . Back pain     Past Surgical History  Procedure Laterality Date  .  Abdominal hysterectomy  1993    partial  . Back surgery  2009    Current Outpatient Prescriptions  Medication Sig Dispense Refill  . amLODipine (NORVASC) 10 MG tablet Take 10 mg by mouth daily.    Marland Kitchen aspirin EC 81 MG tablet Take 81 mg by mouth.    Marland Kitchen HYDROcodone-acetaminophen (NORCO) 5-325 MG per tablet Take 0.5 tablets by mouth as needed. For pain     . ibuprofen (ADVIL,MOTRIN) 800 MG tablet Take 800 mg by mouth every 8 (eight) hours as needed.    Marland Kitchen losartan-hydrochlorothiazide (HYZAAR) 100-25 MG per tablet Take 1 tablet by mouth daily.      . metFORMIN (GLUMETZA) 500 MG (MOD) 24 hr  tablet Take 500 mg by mouth 2 (two) times daily with a meal.      . omeprazole (PRILOSEC) 20 MG capsule Take 20 mg by mouth.    Marland Kitchen acetaminophen (TYLENOL) 500 MG tablet Take 500 mg by mouth as needed. For pain      No current facility-administered medications for this visit.    Allergies as of 07/04/2015  . (No Known Allergies)    Vitals: BP 162/92 mmHg  Pulse 84  Ht 5' 7.5" (1.715 m)  Wt 186 lb 9.6 oz (84.641 kg)  BMI 28.78 kg/m2 Last Weight:  Wt Readings from Last 1 Encounters:  07/04/15 186 lb 9.6 oz (84.641 kg)   Last Height:   Ht Readings from Last 1 Encounters:  07/04/15 5' 7.5" (1.715 m)    Physical exam: Exam: Gen: NAD, conversant, well nourised,  well groomed                     CV: RRR, no MRG. No Carotid Bruits. No peripheral edema, warm, nontender Eyes: Conjunctivae clear without exudates or hemorrhage  Neuro: Detailed Neurologic Exam  Speech:    Speech is normal; fluent and spontaneous with normal comprehension.  Cognition:    The patient is oriented to person, place, and time;     recent and remote memory intact;     language fluent;     normal attention, concentration,     fund of knowledge Cranial Nerves:    The pupils are equal, round, and reactive to light. The fundi are normal and spontaneous venous pulsations are present. Visual fields are full to finger confrontation. Extraocular movements are intact. Trigeminal sensation is intact and the muscles of mastication are normal. The face is symmetric. The palate elevates in the midline. Hearing intact. Voice is normal. Shoulder shrug is normal. The tongue has normal motion without fasciculations.   Coordination:    Normal finger to nose and heel to shin. Normal rapid alternating movements.   Gait:    Heel-toe and tandem gait are normal.   Motor Observation:    No asymmetry, no atrophy, and no involuntary movements noted. Tone:    Normal muscle tone.    Posture:    Posture is normal. normal  erect    Strength: Mild weakness of the right deltoid otherwise strength is V/V in the upper and lower limbs.      Sensation: intact to LT. Romberg negative.     Reflex Exam:  DTR's:    Deep tendon reflexes in the upper and lower extremities are normal bilaterally.   Toes:    The toes are downgoing bilaterally.   Clonus:    Clonus is absent.      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 She endorse neck pain that worsens with valsalva, occipital headaches but denies vision problems, no cranial neuropathies, every now and ten she has episodic dizziness and vertigo. She also describes right arm weakness and distal paresthesias.  Chiari Malformation: Mri of the brain. She has an appointment scheduled with Dr. Venetia MaxonStern in Neurosurgery.  right arm weakness and distal paresthesias: emg/ncs  CC: Dr. Katrinka BlazingSmith and Dr. Mervyn GayArvind  Vicki Chaffin, MD  [facility] [address] [place], [place] [address]  Phone [phone] Fax [phone]

## 2015-07-04 NOTE — Patient Instructions (Addendum)
Overall you are doing fairly well but I do want to suggest a few things today:   Remember to drink plenty of fluid, eat healthy meals and do not skip any meals. Try to eat protein with a every meal and eat a healthy snack such as fruit or nuts in between meals. Try to keep a regular sleep-wake schedule and try to exercise daily, particularly in the form of walking, 20-30 minutes a day, if you can.   As far as diagnostic testing: MRi of the brain, emg/ncs  I would like to see you back for emg/ncs, sooner if we need to. Please call us with any interim questions, concerns, problems, updates or refill requests.   Our phone number is [phone]. We also have an after hours call service for urgent matters and there is a physician on-call for urgent questions. For any emergencies you know to call 911 or go to the nearest emergency room

## 2015-07-05 ENCOUNTER — Telehealth: Payer: Self-pay | Admitting: Neurology

## 2015-07-05 DIAGNOSIS — M79601 Pain in right arm: Secondary | ICD-10-CM

## 2015-07-05 MED ORDER — PREGABALIN 75 MG PO CAPS: 75.0000 mg | ORAL_CAPSULE | Freq: Two times a day (BID) | ORAL | Status: AC

## 2015-07-05 NOTE — Telephone Encounter (Addendum)
Per Dr Lucia GaskinsAhern- can try Lyrica 75mg  2x/day for right arm pain. 60 tabs, 11 refills.

## 2015-07-05 NOTE — Telephone Encounter (Signed)
Pt called and wants to know if there is anything she can do for the pain in her right arm? Please call and advise [phone]

## 2015-07-05 NOTE — Telephone Encounter (Signed)
Faxed rx lyrica to pt pharmacy. Received fax confirmation.

## 2015-07-05 NOTE — Telephone Encounter (Signed)
Called and spoke to pt. Advised per Dr Lucia Gaskinsahern she can try Lyrica 75mg  2x/day. Verified pharmacy. [facility] [employer] [facility]. Told her to call us if she experiences any SE. She verbalized understanding.

## 2015-07-08 ENCOUNTER — Ambulatory Visit (INDEPENDENT_AMBULATORY_CARE_PROVIDER_SITE_OTHER): Payer: Self-pay

## 2015-07-08 DIAGNOSIS — G4484 Primary exertional headache: Secondary | ICD-10-CM

## 2015-07-08 DIAGNOSIS — R42 Dizziness and giddiness: Secondary | ICD-10-CM | POA: Diagnosis not present

## 2015-07-08 DIAGNOSIS — R29898 Other symptoms and signs involving the musculoskeletal system: Secondary | ICD-10-CM

## 2015-07-08 DIAGNOSIS — Z0289 Encounter for other administrative examinations: Secondary | ICD-10-CM

## 2015-07-08 DIAGNOSIS — G935 Compression of brain: Secondary | ICD-10-CM

## 2015-07-08 DIAGNOSIS — R51 Headache: Secondary | ICD-10-CM | POA: Diagnosis not present

## 2015-07-11 ENCOUNTER — Telehealth: Payer: Self-pay | Admitting: Neurology

## 2015-07-11 NOTE — Telephone Encounter (Signed)
Discussed with patient. Chiari is moderate to severe and can be causing her headache symptoms. She has follow up with [facility] in January.   IMPRESSION: This MRI of the brain with and without contrast shows the following:  1. Chiari type I malformation with 11 mm of cerebellar ectopia. No syrinx is noted in the cervical spinal cord down to C4. The adjacent brainstem and spinal cord have normal signal. 2. There are scattered T2/FLAIR hyperintense foci in the subcortical and deep white matter of both frontal lobes. This is most consistent with mild chronic microvascular ischemic changes. 3. Some sequences cannot be completely interpreted due to metal artifact from braces.

## 2015-07-28 ENCOUNTER — Ambulatory Visit (INDEPENDENT_AMBULATORY_CARE_PROVIDER_SITE_OTHER): Payer: Medicare Other | Admitting: Neurology

## 2015-07-28 ENCOUNTER — Ambulatory Visit (INDEPENDENT_AMBULATORY_CARE_PROVIDER_SITE_OTHER): Payer: Self-pay | Admitting: Neurology

## 2015-07-28 DIAGNOSIS — M79601 Pain in right arm: Secondary | ICD-10-CM

## 2015-07-28 DIAGNOSIS — R29898 Other symptoms and signs involving the musculoskeletal system: Secondary | ICD-10-CM

## 2015-07-28 DIAGNOSIS — Z0289 Encounter for other administrative examinations: Secondary | ICD-10-CM

## 2015-07-28 NOTE — Progress Notes (Signed)
See procedure note.

## 2015-07-28 NOTE — Progress Notes (Signed)
  [facility]    Provider:  Dr Lucia GaskinsAhern Referring Provider: Caffie DammeSmith, Karla, MD Primary Care Physician:  Caffie DammeSMITH, KARLA, MD  CC: Right arm and shoulder pain  HPI: Martha Sherman is a 60 y.o. female here as a referral from Dr. Kathrynn SpeedArvind and Dr. Shon BatonBrooks for neck pain, Chiari Malformation found on MRI of the cervical spine. She has neck pain, sharp and stabbing pain and stiffness in the cervical muscles and some cracking in the neck as she turns. She has a history of a fractured right collar bone and she has a lot of right shoulder and collar bone pain, pain in the right arm and pain in the thumb with swelling. She wakes up with tingling in the right fingers. Weak grip in the right hand, difficulty opening jars. The pain is in the deltoids as well like a "jittering" inside.   Summary: Nerve Conduction studies were performed on the bilateral upper extremities.  ADM Ulnar and APB Median motor conductions were within normal limits with normal F wave latencies.  2nd-digit Median, 5th-digit Ulnar and Radial sensory conductions were within normal limits.   EMG needle evaluation was performed on selected right upper extremity muscles: The right Deltoid(multiple areas sampled), right Triceps, right Flexor Digitorum Profundus (ulnar), right Pronator Teres, right First Dorsal Interoseous, right Opponens Pollicis were normal. Patient declined paraspinal needle exam.   Conclusion: This is a normal study. No electrophysiologic evidence for median or ulnar neuropathy or cervical radiculopathy. Will refer back to Dr. Shon BatonBrooks in Orthopaedics for evaluation or imaging of the right proximal arm as clinically warranted in this patient with shoulder pain and previous collar bone fracture. Martha Sherman.    Martha Ahern, MD  [facility] [address] [place], [place] [address]  Phone [phone] Fax [phone]

## 2015-08-01 NOTE — Procedures (Signed)
  [facility]    Provider:  Dr Kenyana Husak Referring Provider: Smith, Karla, MD Primary Care Physician:  SMITH, KARLA, MD  CC: Right arm and shoulder pain  HPI: Martha Sherman is a 59 y.o. female here as a referral from Dr. Arvind and Dr. Brooks for neck pain, Chiari Malformation found on MRI of the cervical spine. She has neck pain, sharp and stabbing pain and stiffness in the cervical muscles and some cracking in the neck as she turns. She has a history of a fractured right collar bone and she has a lot of right shoulder and collar bone pain, pain in the right arm and pain in the thumb with swelling. She wakes up with tingling in the right fingers. Weak grip in the right hand, difficulty opening jars. The pain is in the deltoids as well like a "jittering" inside.   Summary: Nerve Conduction studies were performed on the bilateral upper extremities.  ADM Ulnar and APB Median motor conductions were within normal limits with normal F wave latencies.  2nd-digit Median, 5th-digit Ulnar and Radial sensory conductions were within normal limits.   EMG needle evaluation was performed on selected right upper extremity muscles: The right Deltoid(multiple areas sampled), right Triceps, right Flexor Digitorum Profundus (ulnar), right Pronator Teres, right First Dorsal Interoseous, right Opponens Pollicis were normal. Patient declined paraspinal needle exam.   Conclusion: This is a normal study. No electrophysiologic evidence for median or ulnar neuropathy or cervical radiculopathy. Will refer back to Dr. Brooks in Orthopaedics for evaluation or imaging of the right proximal arm as clinically warranted in this patient with shoulder pain and previous collar bone fracture. .    Martha Kondo, MD  [facility] [address] [place], [place] [address]  Phone [phone] Fax [phone]  

## 2016-09-04 ENCOUNTER — Other Ambulatory Visit: Payer: Self-pay | Admitting: Neurosurgery

## 2016-09-04 DIAGNOSIS — G935 Compression of brain: Secondary | ICD-10-CM

## 2016-09-04 DIAGNOSIS — M50123 Cervical disc disorder at C6-C7 level with radiculopathy: Secondary | ICD-10-CM

## 2016-09-09 ENCOUNTER — Ambulatory Visit
Admission: RE | Admit: 2016-09-09 | Discharge: 2016-09-09 | Disposition: A | Discharge status: Home / Self Care | Payer: Medicare Other | Source: Ambulatory Visit | Attending: Neurosurgery | Admitting: Neurosurgery

## 2016-09-09 DIAGNOSIS — M50123 Cervical disc disorder at C6-C7 level with radiculopathy: Secondary | ICD-10-CM

## 2016-09-11 ENCOUNTER — Ambulatory Visit
Admission: RE | Admit: 2016-09-11 | Discharge: 2016-09-11 | Disposition: A | Discharge status: Home / Self Care | Payer: Medicare Other | Source: Ambulatory Visit | Attending: Neurosurgery | Admitting: Neurosurgery

## 2016-09-11 DIAGNOSIS — G935 Compression of brain: Secondary | ICD-10-CM

## 2016-09-11 MED ORDER — GADOBENATE DIMEGLUMINE 529 MG/ML IV SOLN
17.0000 mL | Freq: Once | INTRAVENOUS | Status: AC | PRN
  Administered 2016-09-11: 17 mL via INTRAVENOUS

## 2017-07-26 DIAGNOSIS — L0232 Furuncle of buttock: Secondary | ICD-10-CM | POA: Insufficient documentation

## 2017-08-14 DIAGNOSIS — N393 Stress incontinence (female) (male): Secondary | ICD-10-CM | POA: Insufficient documentation

## 2017-08-14 DIAGNOSIS — Z90710 Acquired absence of both cervix and uterus: Secondary | ICD-10-CM | POA: Insufficient documentation

## 2017-08-14 DIAGNOSIS — Z01419 Encounter for gynecological examination (general) (routine) without abnormal findings: Secondary | ICD-10-CM | POA: Insufficient documentation

## 2018-01-08 DIAGNOSIS — M79671 Pain in right foot: Secondary | ICD-10-CM | POA: Insufficient documentation

## 2018-04-23 DIAGNOSIS — M545 Low back pain, unspecified: Secondary | ICD-10-CM | POA: Insufficient documentation

## 2018-05-23 ENCOUNTER — Ambulatory Visit (INDEPENDENT_AMBULATORY_CARE_PROVIDER_SITE_OTHER): Payer: Medicare Other | Admitting: Podiatry

## 2018-05-23 ENCOUNTER — Ambulatory Visit (INDEPENDENT_AMBULATORY_CARE_PROVIDER_SITE_OTHER): Payer: Medicare Other

## 2018-05-23 ENCOUNTER — Other Ambulatory Visit: Payer: Self-pay | Admitting: Podiatry

## 2018-05-23 ENCOUNTER — Encounter: Payer: Self-pay | Admitting: Podiatry

## 2018-05-23 DIAGNOSIS — M722 Plantar fascial fibromatosis: Secondary | ICD-10-CM | POA: Diagnosis not present

## 2018-05-23 DIAGNOSIS — M2012 Hallux valgus (acquired), left foot: Secondary | ICD-10-CM | POA: Diagnosis not present

## 2018-05-23 DIAGNOSIS — M2011 Hallux valgus (acquired), right foot: Secondary | ICD-10-CM | POA: Diagnosis not present

## 2018-05-23 MED ORDER — TRIAMCINOLONE ACETONIDE 10 MG/ML IJ SUSP
10.0000 mg | Freq: Once | INTRAMUSCULAR | Status: AC
  Administered 2018-05-23: 10 mg

## 2018-05-23 MED ORDER — DICLOFENAC SODIUM 75 MG PO TBEC: 75.0000 mg | DELAYED_RELEASE_TABLET | Freq: Two times a day (BID) | ORAL | 2 refills | Status: AC

## 2018-05-23 NOTE — Patient Instructions (Signed)
Plantar Fasciitis (Heel Spur Syndrome) with Rehab The plantar fascia is a fibrous, ligament-like, soft-tissue structure that spans the bottom of the foot. Plantar fasciitis is a condition that causes pain in the foot due to inflammation of the tissue. SYMPTOMS   Pain and tenderness on the underneath side of the foot.  Pain that worsens with standing or walking. CAUSES  Plantar fasciitis is caused by irritation and injury to the plantar fascia on the underneath side of the foot. Common mechanisms of injury include:  Direct trauma to bottom of the foot.  Damage to a small nerve that runs under the foot where the main fascia attaches to the heel bone.  Stress placed on the plantar fascia due to bone spurs. RISK INCREASES WITH:   Activities that place stress on the plantar fascia (running, jumping, pivoting, or cutting).  Poor strength and flexibility.  Improperly fitted shoes.  Tight calf muscles.  Flat feet.  Failure to warm-up properly before activity.  Obesity. PREVENTION  Warm up and stretch properly before activity.  Allow for adequate recovery between workouts.  Maintain physical fitness:  Strength, flexibility, and endurance.  Cardiovascular fitness.  Maintain a health body weight.  Avoid stress on the plantar fascia.  Wear properly fitted shoes, including arch supports for individuals who have flat feet.  PROGNOSIS  If treated properly, then the symptoms of plantar fasciitis usually resolve without surgery. However, occasionally surgery is necessary.  RELATED COMPLICATIONS   Recurrent symptoms that may result in a chronic condition.  Problems of the lower back that are caused by compensating for the injury, such as limping.  Pain or weakness of the foot during push-off following surgery.  Chronic inflammation, scarring, and partial or complete fascia tear, occurring more often from repeated injections.  TREATMENT  Treatment initially involves the  use of ice and medication to help reduce pain and inflammation. The use of strengthening and stretching exercises may help reduce pain with activity, especially stretches of the Achilles tendon. These exercises may be performed at home or with a therapist. Your caregiver may recommend that you use heel cups of arch supports to help reduce stress on the plantar fascia. Occasionally, corticosteroid injections are given to reduce inflammation. If symptoms persist for greater than 6 months despite non-surgical (conservative), then surgery may be recommended.   MEDICATION   If pain medication is necessary, then nonsteroidal anti-inflammatory medications, such as aspirin and ibuprofen, or other minor pain relievers, such as acetaminophen, are often recommended.  Do not take pain medication within 7 days before surgery.  Prescription pain relievers may be given if deemed necessary by your caregiver. Use only as directed and only as much as you need.  Corticosteroid injections may be given by your caregiver. These injections should be reserved for the most serious cases, because they may only be given a certain number of times.  HEAT AND COLD  Cold treatment (icing) relieves pain and reduces inflammation. Cold treatment should be applied for 10 to 15 minutes every 2 to 3 hours for inflammation and pain and immediately after any activity that aggravates your symptoms. Use ice packs or massage the area with a piece of ice (ice massage).  Heat treatment may be used prior to performing the stretching and strengthening activities prescribed by your caregiver, physical therapist, or athletic trainer. Use a heat pack or soak the injury in warm water.  SEEK IMMEDIATE MEDICAL CARE IF:  Treatment seems to offer no benefit, or the condition worsens.  Any medications   produce adverse side effects.  EXERCISES- RANGE OF MOTION (ROM) AND STRETCHING EXERCISES - Plantar Fasciitis (Heel Spur Syndrome) These exercises  may help you when beginning to rehabilitate your injury. Your symptoms may resolve with or without further involvement from your physician, physical therapist or athletic trainer. While completing these exercises, remember:   Restoring tissue flexibility helps normal motion to return to the joints. This allows healthier, less painful movement and activity.  An effective stretch should be held for at least 30 seconds.  A stretch should never be painful. You should only feel a gentle lengthening or release in the stretched tissue.  RANGE OF MOTION - Toe Extension, Flexion  Sit with your right / left leg crossed over your opposite knee.  Grasp your toes and gently pull them back toward the top of your foot. You should feel a stretch on the bottom of your toes and/or foot.  Hold this stretch for 10 seconds.  Now, gently pull your toes toward the bottom of your foot. You should feel a stretch on the top of your toes and or foot.  Hold this stretch for 10 seconds. Repeat  times. Complete this stretch 3 times per day.   RANGE OF MOTION - Ankle Dorsiflexion, Active Assisted  Remove shoes and sit on a chair that is preferably not on a carpeted surface.  Place right / left foot under knee. Extend your opposite leg for support.  Keeping your heel down, slide your right / left foot back toward the chair until you feel a stretch at your ankle or calf. If you do not feel a stretch, slide your bottom forward to the edge of the chair, while still keeping your heel down.  Hold this stretch for 10 seconds. Repeat 3 times. Complete this stretch 2 times per day.   STRETCH  Gastroc, Standing  Place hands on wall.  Extend right / left leg, keeping the front knee somewhat bent.  Slightly point your toes inward on your back foot.  Keeping your right / left heel on the floor and your knee straight, shift your weight toward the wall, not allowing your back to arch.  You should feel a gentle stretch  in the right / left calf. Hold this position for 10 seconds. Repeat 3 times. Complete this stretch 2 times per day.  STRETCH  Soleus, Standing  Place hands on wall.  Extend right / left leg, keeping the other knee somewhat bent.  Slightly point your toes inward on your back foot.  Keep your right / left heel on the floor, bend your back knee, and slightly shift your weight over the back leg so that you feel a gentle stretch deep in your back calf.  Hold this position for 10 seconds. Repeat 3 times. Complete this stretch 2 times per day.  STRETCH  Gastrocsoleus, Standing  Note: This exercise can place a lot of stress on your foot and ankle. Please complete this exercise only if specifically instructed by your caregiver.   Place the ball of your right / left foot on a step, keeping your other foot firmly on the same step.  Hold on to the wall or a rail for balance.  Slowly lift your other foot, allowing your body weight to press your heel down over the edge of the step.  You should feel a stretch in your right / left calf.  Hold this position for 10 seconds.  Repeat this exercise with a slight bend in your right /   left knee. Repeat 3 times. Complete this stretch 2 times per day.   STRENGTHENING EXERCISES - Plantar Fasciitis (Heel Spur Syndrome)  These exercises may help you when beginning to rehabilitate your injury. They may resolve your symptoms with or without further involvement from your physician, physical therapist or athletic trainer. While completing these exercises, remember:   Muscles can gain both the endurance and the strength needed for everyday activities through controlled exercises.  Complete these exercises as instructed by your physician, physical therapist or athletic trainer. Progress the resistance and repetitions only as guided.  STRENGTH - Towel Curls  Sit in a chair positioned on a non-carpeted surface.  Place your foot on a towel, keeping your heel  on the floor.  Pull the towel toward your heel by only curling your toes. Keep your heel on the floor. Repeat 3 times. Complete this exercise 2 times per day.  STRENGTH - Ankle Inversion  Secure one end of a rubber exercise band/tubing to a fixed object (table, pole). Loop the other end around your foot just before your toes.  Place your fists between your knees. This will focus your strengthening at your ankle.  Slowly, pull your big toe up and in, making sure the band/tubing is positioned to resist the entire motion.  Hold this position for 10 seconds.  Have your muscles resist the band/tubing as it slowly pulls your foot back to the starting position. Repeat 3 times. Complete this exercises 2 times per day.  Document Released: 07/09/2005 Document Revised: 10/01/2011 Document Reviewed: 10/21/2008 [employer] Patient Information 2014 [employer] Bunion A bunion is a bump on the base of the big toe that forms when the bones of the big toe joint move out of position. Bunions may be small at first, but they often get larger over time. The can make walking painful. What are the causes? A bunion may be caused by:  Wearing narrow or pointed shoes that force the big toe to press against the other toes.  Abnormal foot development that causes the foot to roll inward (pronate).  Changes in the foot that are caused by certain diseases, such as rheumatoid arthritis and polio.  A foot injury.  What increases the risk? The following factors may make you more likely to develop this condition:  Wearing shoes that squeeze the toes together.  Having certain diseases, such as: ? Rheumatoid arthritis. ? Polio. ? Cerebral palsy.  Having family members who have bunions.  Being born with a foot deformity, such as flat feet or low arches.  Doing activities that put a lot of pressure on the feet, such as ballet dancing.  What are the signs or symptoms? The main symptom of a bunion is a  noticeable bump on the big toe. Other symptoms may include:  Pain.  Swelling around the big toe.  Redness and inflammation.  Thick or hardened skin on the big toe or between the toes.  Stiffness or loss of motion in the big toe.  Trouble with walking.  How is this diagnosed? A bunion may be diagnosed based on your symptoms, medical history, and activities. You may have tests, such as:  X-rays. These allow your health care provider to check the position of the bones in your foot and look for damage to your joint. They also help your health care provider to determine the severity of your bunion and the best way to treat it.  Joint aspiration. In this test, a sample of fluid is removed from   the toe joint. This test, which may be done if you are in a lot of pain, helps to rule out diseases that cause painful swelling of the joints, such as arthritis.  How is this treated? There is no cure for a bunion, but treatment can help to prevent a bunion from getting worse. Treatment depends on the severity of your symptoms. Your health care provider may recommend:  Wearing shoes that have a wide toe box.  Using bunion pads to cushion the affected area.  Taping your toes together to keep them in a normal position.  Placing a device inside your shoe (orthotics) to help reduce pressure on your toe joint.  Taking medicine to ease pain, inflammation, and swelling.  Applying heat or ice to the affected area.  Doing stretching exercises.  Surgery to remove scar tissue and move the toes back into their normal position. This treatment is rare.  Follow these instructions at home:  Support your toe joint with proper footwear, shoe padding, or taping as told by your health care provider.  Take over-the-counter and prescription medicines only as told by your health care provider.  If directed, apply ice to the injured area: ? Put ice in a plastic bag. ? Place a towel between your skin and the  bag. ? Leave the ice on for 20 minutes, 2-3 times per day.  If directed, apply heat to the affected area before you exercise. Use the heat source that your health care provider recommends, such as a moist heat pack or a heating pad. ? Place a towel between your skin and the heat source. ? Leave the heat on for 20-30 minutes. ? Remove the heat if your skin turns bright red. This is especially important if you are unable to feel pain, heat, or cold. You may have a greater risk of getting burned.  Do exercises as told by your health care provider.  Keep all follow-up visits as told by your health care provider. Contact a health care provider if:  Your symptoms get worse.  Your symptoms do not improve in 2 weeks. Get help right away if:  You have severe pain and trouble with walking. This information is not intended to replace advice given to you by your health care provider. Make sure you discuss any questions you have with your health care provider. Document Released: 07/09/2005 Document Revised: 12/15/2015 Document Reviewed: 02/06/2015 [employer] Interactive Patient Education  2018 [employer]  

## 2018-05-28 NOTE — Progress Notes (Signed)
Subjective:   Patient ID: Martha Sherman, female   DOB: 62 y.o.   MRN: [id number]   HPI Patient states that she has a painful bunion deformity right over left and also has exquisite discomfort bottom of the left heel for about 6 months duration.  States she is tried wider shoes she is tried soaks without relief symptoms of gradually gotten worse.  Patient has no history of smoking and likes to be active   Review of Systems  All other systems reviewed and are negative.       Objective:  Physical Exam  Constitutional: She appears well-developed and well-nourished.  Cardiovascular: Intact distal pulses.  Pulmonary/Chest: Effort normal.  Musculoskeletal: Normal range of motion.  Neurological: She is alert.  Skin: Skin is warm.  Nursing note and vitals reviewed.   Neurovascular status intact muscle strength is adequate range of motion within normal limits with patient found to have hyperostosis of the medial aspect first metatarsal head right over left with redness around the surface and pain with palpation with exquisite discomfort of the plantar heel left at the insertional point tendon into the calcaneus     Assessment:  Acute plantar fasciitis left with painful bunion deformity right     Plan:  H&P discussed both conditions and will get a focus on the heel and I did discuss bunion correction which she wants to do and will reappoint for consult to discuss in greater detail in 2 weeks.  Today for the heel I did sterile prep of the area injected directly into the fascia at its insertion calcaneus 3 mg Kenalog 5 mg Xylocaine and applied fascial brace.  Reappoint 2 weeks to recheck  X-ray indicates there is elevation of the intermetatarsal angle bilateral with small plantar spur with no indication of stress fracture arthritis

## 2018-06-05 ENCOUNTER — Other Ambulatory Visit: Payer: Self-pay

## 2018-06-05 ENCOUNTER — Emergency Department (HOSPITAL_BASED_OUTPATIENT_CLINIC_OR_DEPARTMENT_OTHER): Payer: Medicare Other

## 2018-06-05 ENCOUNTER — Emergency Department (HOSPITAL_BASED_OUTPATIENT_CLINIC_OR_DEPARTMENT_OTHER)
Admission: EM | Admit: 2018-06-05 | Discharge: 2018-06-05 | Disposition: A | Discharge status: Home / Self Care | Payer: Medicare Other | Attending: Emergency Medicine | Admitting: Emergency Medicine

## 2018-06-05 ENCOUNTER — Encounter (HOSPITAL_BASED_OUTPATIENT_CLINIC_OR_DEPARTMENT_OTHER): Payer: Self-pay | Admitting: *Deleted

## 2018-06-05 DIAGNOSIS — Y929 Unspecified place or not applicable: Secondary | ICD-10-CM | POA: Diagnosis not present

## 2018-06-05 DIAGNOSIS — Z79899 Other long term (current) drug therapy: Secondary | ICD-10-CM | POA: Insufficient documentation

## 2018-06-05 DIAGNOSIS — Z7984 Long term (current) use of oral hypoglycemic drugs: Secondary | ICD-10-CM | POA: Insufficient documentation

## 2018-06-05 DIAGNOSIS — Z7982 Long term (current) use of aspirin: Secondary | ICD-10-CM | POA: Insufficient documentation

## 2018-06-05 DIAGNOSIS — I10 Essential (primary) hypertension: Secondary | ICD-10-CM | POA: Diagnosis not present

## 2018-06-05 DIAGNOSIS — S161XXA Strain of muscle, fascia and tendon at neck level, initial encounter: Secondary | ICD-10-CM

## 2018-06-05 DIAGNOSIS — E119 Type 2 diabetes mellitus without complications: Secondary | ICD-10-CM | POA: Diagnosis not present

## 2018-06-05 DIAGNOSIS — Y999 Unspecified external cause status: Secondary | ICD-10-CM | POA: Diagnosis not present

## 2018-06-05 DIAGNOSIS — Y33XXXA Other specified events, undetermined intent, initial encounter: Secondary | ICD-10-CM | POA: Insufficient documentation

## 2018-06-05 DIAGNOSIS — S199XXA Unspecified injury of neck, initial encounter: Secondary | ICD-10-CM | POA: Diagnosis present

## 2018-06-05 DIAGNOSIS — M542 Cervicalgia: Secondary | ICD-10-CM

## 2018-06-05 DIAGNOSIS — Y939 Activity, unspecified: Secondary | ICD-10-CM | POA: Insufficient documentation

## 2018-06-05 DIAGNOSIS — G935 Compression of brain: Secondary | ICD-10-CM | POA: Insufficient documentation

## 2018-06-05 HISTORY — DX: Personal history of other diseases of the nervous system and sense organs: Z86.69

## 2018-06-05 LAB — CBC WITH DIFFERENTIAL/PLATELET
Abs Immature Granulocytes: 0.02 10*3/uL (ref 0.00–0.07)
BASOS ABS: 0 10*3/uL (ref 0.0–0.1)
Basophils Relative: 0 %
EOS PCT: 2 %
Eosinophils Absolute: 0.1 10*3/uL (ref 0.0–0.5)
HCT: 41.1 % (ref 36.0–46.0)
HEMOGLOBIN: 12.5 g/dL (ref 12.0–15.0)
IMMATURE GRANULOCYTES: 0 %
LYMPHS PCT: 43 %
Lymphs Abs: 2.6 10*3/uL (ref 0.7–4.0)
MCH: 25.4 pg — ABNORMAL LOW (ref 26.0–34.0)
MCHC: 30.4 g/dL (ref 30.0–36.0)
MCV: 83.4 fL (ref 80.0–100.0)
Monocytes Absolute: 0.4 10*3/uL (ref 0.1–1.0)
Monocytes Relative: 7 %
NRBC: 0 % (ref 0.0–0.2)
Neutro Abs: 3 10*3/uL (ref 1.7–7.7)
Neutrophils Relative %: 48 %
Platelets: 312 10*3/uL (ref 150–400)
RBC: 4.93 MIL/uL (ref 3.87–5.11)
RDW: 15.2 % (ref 11.5–15.5)
WBC: 6.1 10*3/uL (ref 4.0–10.5)

## 2018-06-05 LAB — BASIC METABOLIC PANEL
ANION GAP: 8 (ref 5–15)
BUN: 11 mg/dL (ref 6–20)
CALCIUM: 9 mg/dL (ref 8.9–10.3)
CHLORIDE: 104 mmol/L (ref 98–111)
CO2: 26 mmol/L (ref 22–32)
CREATININE: 0.54 mg/dL (ref 0.44–1.00)
GFR calc non Af Amer: >60 mL/min (ref >60)
Glucose, Bld: 140 mg/dL — ABNORMAL HIGH (ref 70–99)
Potassium: 3.6 mmol/L (ref 3.5–5.1)
SODIUM: 138 mmol/L (ref 135–145)

## 2018-06-05 MED ORDER — DEXAMETHASONE SODIUM PHOSPHATE 10 MG/ML IJ SOLN
10.0000 mg | Freq: Once | INTRAMUSCULAR | Status: AC
  Administered 2018-06-05: 10 mg via INTRAVENOUS
  Filled 2018-06-05: qty 1

## 2018-06-05 MED ORDER — MORPHINE SULFATE (PF) 4 MG/ML IV SOLN
4.0000 mg | Freq: Once | INTRAVENOUS | Status: AC
  Administered 2018-06-05: 4 mg via INTRAVENOUS
  Filled 2018-06-05: qty 1

## 2018-06-05 MED ORDER — IOPAMIDOL (ISOVUE-370) INJECTION 76%
100.0000 mL | Freq: Once | INTRAVENOUS | Status: AC | PRN
  Administered 2018-06-05: 100 mL via INTRAVENOUS

## 2018-06-05 MED ORDER — DIAZEPAM 5 MG/ML IJ SOLN
5.0000 mg | Freq: Once | INTRAMUSCULAR | Status: AC
  Administered 2018-06-05: 5 mg via INTRAMUSCULAR
  Filled 2018-06-05: qty 2

## 2018-06-05 MED ORDER — TIZANIDINE HCL 2 MG PO TABS: 2.0000 mg | ORAL_TABLET | Freq: Four times a day (QID) | ORAL | 0 refills | Status: AC | PRN

## 2018-06-05 NOTE — ED Triage Notes (Addendum)
Sharp stabbing pain in the right side of her neck x 1 hour. No injury. Hx of Chiari Malformation.

## 2018-06-05 NOTE — ED Provider Notes (Signed)
[facility] EMERGENCY DEPARTMENT Provider Note   CSN: [id number] Arrival date & time: 06/05/18  1825     History   Chief Complaint Chief Complaint  Patient presents with  . Neck Pain    HPI Martha Sherman is a 62 y.o. female.  HPI   1 hour ago having stabbing pain to right side of neck, coming and going Has heating pad which helepd some at first but then 15 minutes later it had severe stabbing again. Some pain when turning head to left No symptoms with it Not worse with palpation Was sitting when it started No headache, no chest pain or dyspnea Pain just on right side going toward the back NO fevers, trauma Hx of chiari, has seen Dr. Venetia Maxon, saw him 6 mos ago  Past Medical History:  Diagnosis Date  . Acid reflux   . Back pain   . Diabetes mellitus   . High cholesterol   . History of Chiari malformation   . Hypertension   . Irregular heart beats    occassional  . Murmur     Patient Active Problem List   Diagnosis Date Noted  . Lumbar pain 04/23/2018  . Pain in right foot 01/08/2018  . H/O: hysterectomy 08/14/2017  . SUI (stress urinary incontinence, female) 08/14/2017  . Women's annual routine gynecological examination 08/14/2017  . Boil of buttock 07/26/2017  . Chiari malformation type I (HCC) 07/04/2015  . Essential hypertension 01/19/2015  . Right arm pain 01/19/2015  . DM (diabetes mellitus) (HCC) 01/19/2015  . Cough 01/18/2015  . Shortness of breath 01/18/2015  . Pain of right heel 02/19/2014  . Plantar fasciitis of right foot 02/19/2014  . Metatarsal deformity 02/19/2014    Past Surgical History:  Procedure Laterality Date  . ABDOMINAL HYSTERECTOMY  1993   partial  . BACK SURGERY  2009     OB History   None      Home Medications    Prior to Admission medications   Medication Sig Start Date End Date Taking? Authorizing Provider  acetaminophen (TYLENOL) 500 MG tablet Take 500 mg by mouth as needed. For pain     [provider]  amLODipine (NORVASC) 10 MG tablet Take 10 mg by mouth daily.    [provider]  aspirin EC 81 MG tablet Take 81 mg by mouth.    [provider]  atorvastatin (LIPITOR) 10 MG tablet atorvastatin 10 mg tablet    [provider]  cephALEXin (KEFLEX) 500 MG capsule cephalexin 500 mg capsule    [provider]  diclofenac (VOLTAREN) 75 MG EC tablet Take by mouth. 07/31/16   [provider]  diclofenac (VOLTAREN) 75 MG EC tablet Take 1 tablet (75 mg total) by mouth 2 (two) times daily. 05/23/18   Lenn Sink, DPM  Dulaglutide (TRULICITY) 0.75 MG/0.5ML SOPN Trulicity 0.75 mg/0.5 mL subcutaneous pen injector    [provider]  empagliflozin (JARDIANCE) 25 MG TABS tablet Take by mouth. 08/27/16   [provider]  fluconazole (DIFLUCAN) 100 MG tablet fluconazole 100 mg tablet 07/26/17   [provider]  gabapentin (NEURONTIN) 300 MG capsule gabapentin 300 mg capsule 07/31/16   [provider]  HYDROcodone-acetaminophen (NORCO) 5-325 MG per tablet Take 0.5 tablets by mouth as needed. For pain     [provider]  hydrocortisone (PROCTOSOL HC) 2.5 % rectal cream Proctosol HC 2.5 % topical cream perineal applicator  APPLY RECTALLY 3 TIMES A DAY  [provider]  ibuprofen (ADVIL,MOTRIN) 800 MG tablet Take 800 mg by mouth every 8 (eight) hours as needed.    [provider]  losartan-hydrochlorothiazide (HYZAAR) 100-25 MG per tablet Take 1 tablet by mouth daily.      [provider]  metFORMIN (GLUMETZA) 500 MG (MOD) 24 hr tablet Take 500 mg by mouth 2 (two) times daily with a meal.      [provider]  metoCLOPramide (REGLAN) 5 MG tablet metoclopramide 5 mg tablet    [provider]  omeprazole (PRILOSEC) 20 MG capsule Take 20 mg by mouth.    [provider]  prednisoLONE acetate (PRED FORTE) 1 % ophthalmic suspension prednisolone acetate 1 % eye  drops,suspension    [provider]  predniSONE (STERAPRED UNI-PAK 21 TAB) 5 MG (21) TBPK tablet TAKE 6 TABLETS ON DAY 1 AS DIRECTED ON PACKAGE AND DECREASE BY 1 TAB EACH DAY FOR A TOTAL OF 6 DAYS 04/23/18   [provider]  pregabalin (LYRICA) 75 MG capsule Take 1 capsule (75 mg total) by mouth 2 (two) times daily. 07/05/15   Anson Fret, MD  promethazine (PHENERGAN) 12.5 MG tablet promethazine 12.5 mg tablet    [provider]  sulfamethoxazole-trimethoprim (BACTRIM DS,SEPTRA DS) 800-160 MG tablet sulfamethoxazole 800 mg-trimethoprim 160 mg tablet 07/26/17   [provider]  tiZANidine (ZANAFLEX) 2 MG tablet Take 1 tablet (2 mg total) by mouth every 6 (six) hours as needed for muscle spasms. 06/05/18   Alvira Monday, MD  traMADol (ULTRAM) 50 MG tablet tramadol 50 mg tablet    [provider]    Family History Family History  Problem Relation Age of Onset  . Heart attack Mother   . Cancer Father   . Alzheimer's disease Father   . Hypertension Sister   . Hypertension Brother     Social History Social History   Tobacco Use  . Smoking status: Never Smoker  . Smokeless tobacco: Never Used  Substance Use Topics  . Alcohol use: No  . Drug use: No     Allergies   Other   Review of Systems Review of Systems  Constitutional: Negative for fever.  HENT: Negative for sore throat.   Eyes: Negative for visual disturbance.  Respiratory: Negative for cough and shortness of breath.   Cardiovascular: Negative for chest pain.  Gastrointestinal: Negative for abdominal pain.  Genitourinary: Negative for difficulty urinating.  Musculoskeletal: Positive for neck pain. Negative for back pain.  Skin: Negative for rash.  Neurological: Negative for syncope, weakness, numbness and headaches.     Physical Exam Updated Vital Signs BP (!) 158/84   Pulse 72   Temp 98.7 F (37.1 C) (Oral)   Resp 18   Ht 5' 7.5" (1.715 m)   Wt 85.3 kg   SpO2  100%   BMI 29.01 kg/m   Physical Exam  Constitutional: She is oriented to person, place, and time. She appears well-developed and well-nourished. No distress.  HENT:  Head: Normocephalic and atraumatic.  Eyes: Conjunctivae and EOM are normal.  Neck: Muscular tenderness (right sided ) present.  Cardiovascular: Normal rate, regular rhythm, normal heart sounds and intact distal pulses. Exam reveals no gallop and no friction rub.  No murmur heard. Pulmonary/Chest: Effort normal and breath sounds normal. No respiratory distress. She has no wheezes. She has no rales.  Abdominal: Soft. She exhibits no distension. There is no tenderness. There is no guarding.  Musculoskeletal: She exhibits no edema or tenderness.  Neurological: She is alert and oriented to person, place, and time. She has normal strength. No sensory deficit.  Skin: Skin is warm and dry. No rash noted. She is not diaphoretic. No erythema.  Nursing note and vitals reviewed.    ED Treatments / Results  Labs (all labs ordered are listed, but only abnormal results are displayed) Labs Reviewed  CBC WITH DIFFERENTIAL/PLATELET - Abnormal; Notable for the following components:      Result Value   MCH 25.4 (*)    All other components within normal limits  BASIC METABOLIC PANEL - Abnormal; Notable for the following components:   Glucose, Bld 140 (*)    All other components within normal limits    EKG EKG Interpretation  Date/Time:  Thursday June 05 2018 18:39:37 EST Ventricular Rate:  68 PR Interval:    QRS Duration: 86 QT Interval:  400 QTC Calculation: 426 R Axis:   55 Text Interpretation:  Sinus rhythm No significant change since last tracing Confirmed by Alvira MondaySchlossman, Jamilet Ambroise ([id number]) on 06/05/2018 8:57:16 PM   Radiology Ct Angio Head W Or Wo Contrast  Result Date: 06/05/2018 CLINICAL DATA:  Sharp stabbing RIGHT neck pain for 5 hours. History of Chiari malformation, hypertension and diabetes. EXAM: CT ANGIOGRAPHY  HEAD AND NECK TECHNIQUE: Multidetector CT imaging of the head and neck was performed using the standard protocol during bolus administration of intravenous contrast. Multiplanar CT image reconstructions and MIPs were obtained to evaluate the vascular anatomy. Carotid stenosis measurements (when applicable) are obtained utilizing NASCET criteria, using the distal internal carotid diameter as the denominator. CONTRAST:  100mL ISOVUE-370 IOPAMIDOL (ISOVUE-370) INJECTION 76% COMPARISON:  MRI head September 11, 2016 and MRI cervical spine September 09, 2016. FINDINGS: CT HEAD FINDINGS BRAIN: No intraparenchymal hemorrhage, mass effect nor midline shift. The ventricles and sulci are normal for age. Minimal supratentorial white matter hypodensities within normal range for patient's age, though non-specific are most compatible with chronic small vessel ischemic disease. No acute large vascular territory infarcts. No abnormal extra-axial fluid collections. Basal cisterns are patent. Cerebellar tonsils descend 15 mm below the foramen magnum with pointed appearance and effaced cerebral spinal fluid space. VASCULAR: Trace calcific atherosclerosis of the carotid siphons. SKULL: No skull fracture. No significant scalp soft tissue swelling. SINUSES/ORBITS: Trace paranasal sinus mucosal thickening. Mastoid air cells are well aerated.The included ocular globes and orbital contents are non-suspicious. OTHER: None. CTA NECK FINDINGS: AORTIC ARCH: Normal appearance of the thoracic arch, 2 vessel arch is a normal variant. The origins of the innominate, left Common carotid artery and subclavian artery are widely patent. RIGHT CAROTID SYSTEM: Common carotid artery is patent. Normal appearance of the carotid bifurcation without hemodynamically significant stenosis by NASCET criteria. Normal appearance of the internal carotid artery. LEFT CAROTID SYSTEM: Common carotid artery is patent. Normal appearance of the carotid bifurcation without  hemodynamically significant stenosis by NASCET criteria. Normal appearance of the internal carotid artery. VERTEBRAL ARTERIES:Codominant vertebral arteries. Widely patent vertebral arteries. Mildly tortuous vessels seen with chronic hypertension. No luminal irregularity or dissection flap. SKELETON: No acute osseous process though bone windows have not been submitted. OTHER NECK: Soft tissues of the neck are nonacute though, not tailored for evaluation. UPPER CHEST: Included lung apices are clear. No superior mediastinal lymphadenopathy. CTA HEAD FINDINGS: ANTERIOR CIRCULATION: Patent cervical internal carotid arteries, petrous, cavernous and supra clinoid internal carotid arteries. Patent anterior communicating artery. Patent anterior and middle cerebral arteries. No large vessel occlusion, significant stenosis, contrast extravasation or aneurysm. POSTERIOR CIRCULATION: Patent vertebral arteries,  vertebrobasilar junction and basilar artery, as well as main branch vessels. Patent posterior cerebral arteries. No large vessel occlusion, significant stenosis, contrast extravasation or aneurysm. VENOUS SINUSES: Major dural venous sinuses are patent though not tailored for evaluation on this angiographic examination. ANATOMIC VARIANTS: Hypoplastic RIGHT A1 segment. DELAYED PHASE: No abnormal intracranial enhancement. MIP images reviewed. IMPRESSION: CT HEAD: 1. No acute intracranial process. 2. Stable Chiari 1 malformation. 3. Otherwise normal CT HEAD with and without contrast for age. CTA NECK: 1. No acute vascular process. 2. No hemodynamically significant stenosis ICA's. Patent vertebral arteries. CTA HEAD: 1. Normal CTA HEAD. Electronically Signed   By: Awilda Metro M.D.   On: 06/05/2018 22:21   Ct Angio Neck W And/or Wo Contrast  Result Date: 06/05/2018 CLINICAL DATA:  Sharp stabbing RIGHT neck pain for 5 hours. History of Chiari malformation, hypertension and diabetes. EXAM: CT ANGIOGRAPHY HEAD AND NECK  TECHNIQUE: Multidetector CT imaging of the head and neck was performed using the standard protocol during bolus administration of intravenous contrast. Multiplanar CT image reconstructions and MIPs were obtained to evaluate the vascular anatomy. Carotid stenosis measurements (when applicable) are obtained utilizing NASCET criteria, using the distal internal carotid diameter as the denominator. CONTRAST:  ISOVUE-370 IOPAMIDOL (ISOVUE-370) INJECTION 76% COMPARISON:  MRI head September 11, 2016 and MRI cervical spine September 09, 2016. FINDINGS: CT HEAD FINDINGS BRAIN: No intraparenchymal hemorrhage, mass effect nor midline shift. The ventricles and sulci are normal for age. Minimal supratentorial white matter hypodensities within normal range for patient's age, though non-specific are most compatible with chronic small vessel ischemic disease. No acute large vascular territory infarcts. No abnormal extra-axial fluid collections. Basal cisterns are patent. Cerebellar tonsils descend 15 mm below the foramen magnum with pointed appearance and effaced cerebral spinal fluid space. VASCULAR: Trace calcific atherosclerosis of the carotid siphons. SKULL: No skull fracture. No significant scalp soft tissue swelling. SINUSES/ORBITS: Trace paranasal sinus mucosal thickening. Mastoid air cells are well aerated.The included ocular globes and orbital contents are non-suspicious. OTHER: None. CTA NECK FINDINGS: AORTIC ARCH: Normal appearance of the thoracic arch, 2 vessel arch is a normal variant. The origins of the innominate, left Common carotid artery and subclavian artery are widely patent. RIGHT CAROTID SYSTEM: Common carotid artery is patent. Normal appearance of the carotid bifurcation without hemodynamically significant stenosis by NASCET criteria. Normal appearance of the internal carotid artery. LEFT CAROTID SYSTEM: Common carotid artery is patent. Normal appearance of the carotid bifurcation without hemodynamically  significant stenosis by NASCET criteria. Normal appearance of the internal carotid artery. VERTEBRAL ARTERIES:Codominant vertebral arteries. Widely patent vertebral arteries. Mildly tortuous vessels seen with chronic hypertension. No luminal irregularity or dissection flap. SKELETON: No acute osseous process though bone windows have not been submitted. OTHER NECK: Soft tissues of the neck are nonacute though, not tailored for evaluation. UPPER CHEST: Included lung apices are clear. No superior mediastinal lymphadenopathy. CTA HEAD FINDINGS: ANTERIOR CIRCULATION: Patent cervical internal carotid arteries, petrous, cavernous and supra clinoid internal carotid arteries. Patent anterior communicating artery. Patent anterior and middle cerebral arteries. No large vessel occlusion, significant stenosis, contrast extravasation or aneurysm. POSTERIOR CIRCULATION: Patent vertebral arteries, vertebrobasilar junction and basilar artery, as well as main branch vessels. Patent posterior cerebral arteries. No large vessel occlusion, significant stenosis, contrast extravasation or aneurysm. VENOUS SINUSES: Major dural venous sinuses are patent though not tailored for evaluation on this angiographic examination. ANATOMIC VARIANTS: Hypoplastic RIGHT A1 segment. DELAYED PHASE: No abnormal intracranial enhancement. MIP images reviewed. IMPRESSION: CT HEAD: 1. No acute  intracranial process. 2. Stable Chiari 1 malformation. 3. Otherwise normal CT HEAD with and without contrast for age. CTA NECK: 1. No acute vascular process. 2. No hemodynamically significant stenosis ICA's. Patent vertebral arteries. CTA HEAD: 1. Normal CTA HEAD. Electronically Signed   By: Awilda Metro M.D.   On: 06/05/2018 22:21    Procedures Procedures (including critical care time)  Medications Ordered in ED Medications  diazepam (VALIUM) injection 5 mg (5 mg Intramuscular Given 06/05/18 1931)  dexamethasone (DECADRON) injection 10 mg (10 mg  Intravenous Given 06/05/18 2043)  morphine 4 MG/ML injection 4 mg (4 mg Intravenous Given 06/05/18 2047)  iopamidol (ISOVUE-370) 76 % injection 100 mL (100 mLs Intravenous Contrast Given 06/05/18 2138)     Initial Impression / Assessment and Plan / ED Course  I have reviewed the triage vital signs and the nursing notes.  Pertinent labs & imaging results that were available during my care of the patient were reviewed by me and considered in my medical decision making (see chart for details).     62 year old female with a history above, including family history of aneurysms, and personal history of Chiari malformation, presents with concern for sudden onset of stabbing neck pain.  Hx, exam not consistent with ACS or intraabdominal pain.  CT a head and neck were done which showed no evidence of dissection or aneurysm, and showed no other acute abnormalities.  Patient has a Chiari I malformation which is unchanged.  There is a craving secondary to muscular cervical strain, possible disc herniation, or potentially symptoms secondary to her Chiari malformation.  She reports some improvement of symptoms after treatment in the emergency department, with morphine, Decadron and Valium.  Given her history of Chiari malformation, with neck pain, recommend follow-up with Dr. Venetia Maxon of neurosurgery.  Given prescription for Zanaflex for pain.  Recommend heat, ibuprofen, tylenol. Patient discharged in stable condition with understanding of reasons to return.   Final Clinical Impressions(s) / ED Diagnoses   Final diagnoses:  Neck pain on right side  Strain of neck muscle, initial encounter  Chiari malformation type I [facility])    ED Discharge Orders         Ordered    tiZANidine (ZANAFLEX) 2 MG tablet  Every 6 hours PRN     06/05/18 2250           Alvira Monday, MD 06/06/18 [phone]

## 2018-06-09 ENCOUNTER — Ambulatory Visit: Payer: Medicare Other | Admitting: Podiatry

## 2018-06-23 ENCOUNTER — Ambulatory Visit: Payer: Medicare Other | Admitting: Podiatry

## 2018-09-26 ENCOUNTER — Ambulatory Visit (INDEPENDENT_AMBULATORY_CARE_PROVIDER_SITE_OTHER): Payer: Medicare Other | Admitting: Podiatry

## 2018-09-26 ENCOUNTER — Encounter: Payer: Self-pay | Admitting: Podiatry

## 2018-09-26 DIAGNOSIS — M722 Plantar fascial fibromatosis: Secondary | ICD-10-CM

## 2018-09-26 MED ORDER — TRIAMCINOLONE ACETONIDE 10 MG/ML IJ SUSP
10.0000 mg | Freq: Once | INTRAMUSCULAR | Status: AC
  Administered 2018-09-26: 10 mg

## 2018-09-26 MED ORDER — DICLOFENAC SODIUM 75 MG PO TBEC: 75.0000 mg | DELAYED_RELEASE_TABLET | Freq: Two times a day (BID) | ORAL | 2 refills | Status: AC

## 2018-09-28 NOTE — Progress Notes (Signed)
Subjective:   Patient ID: Martha Sherman, female   DOB: 63 y.o.   MRN: [id number]   HPI Patient states she has had a flareup of heel pain left and is really been bothering her for the last few weeks   ROS      Objective:  Physical Exam  Neurovascular status intact with exquisite discomfort plantar aspect left heel at the insertional point tendon calcaneus     Assessment:  Acute plantar fasciitis left noted     Plan:  H&P condition reviewed and today I reinjected the plantar fascial 3 mg Kenalog 5 mg Xylocaine and applied fascial brace to hold up the arch with instructions on usage.  Reappoint to recheck again in the next several weeks or earlier if needed and gave instructions for physical therapy

## 2018-12-04 ENCOUNTER — Ambulatory Visit: Payer: Medicare Other | Admitting: Podiatry

## 2018-12-05 ENCOUNTER — Ambulatory Visit: Payer: Medicare Other | Admitting: Podiatry

## 2018-12-10 ENCOUNTER — Other Ambulatory Visit: Payer: Self-pay

## 2018-12-10 ENCOUNTER — Ambulatory Visit (INDEPENDENT_AMBULATORY_CARE_PROVIDER_SITE_OTHER): Payer: Medicare Other | Admitting: Podiatry

## 2018-12-10 ENCOUNTER — Encounter: Payer: Self-pay | Admitting: Podiatry

## 2018-12-10 VITALS — Temp 97.3°F

## 2018-12-10 DIAGNOSIS — M2012 Hallux valgus (acquired), left foot: Secondary | ICD-10-CM | POA: Diagnosis not present

## 2018-12-10 MED ORDER — DICLOFENAC SODIUM 75 MG PO TBEC: 75.0000 mg | DELAYED_RELEASE_TABLET | Freq: Two times a day (BID) | ORAL | 2 refills | Status: AC

## 2018-12-11 NOTE — Progress Notes (Signed)
Subjective:   Patient ID: Martha Sherman, female   DOB: 63 y.o.   MRN: [id number]   HPI Patient presents stating that her left heel has been absolutely killing her and that she is having trouble doing any form of walking on it.  Also continues to have bunion deformity right over left   ROS      Objective:  Physical Exam  Neurovascular status intact with exquisite discomfort medial plantar fascial left at the insertional point of the tendon into the calcaneus with structural bunion deformity right over left     Assessment:  Continued acute plantar fasciitis left over right that is very tender when pressed with failure to respond so far conservatively with structural bunion deformity right over left     Plan:  H&P and reviewed condition at great length.  At this point we are in a try conservative care again to try complete immobilization of the left lower leg along with aggressive ice and oral diclofenac and see the results and if symptoms do not improve with complete immobilization surgical intervention will most likely be necessary in this condition.  Patient understands all of this and I reviewed with her surgery and the possibility also for structural bunion correction and she will be seen back in several weeks to make final decisions and see how she responds to complete immobilization.  Boot dispensed today with all instructions on usage that she left wearing boot out of the office

## 2018-12-24 ENCOUNTER — Ambulatory Visit: Payer: Medicare Other | Admitting: Podiatry

## 2019-08-14 ENCOUNTER — Encounter (HOSPITAL_BASED_OUTPATIENT_CLINIC_OR_DEPARTMENT_OTHER): Payer: Self-pay | Admitting: *Deleted

## 2019-08-14 ENCOUNTER — Emergency Department (HOSPITAL_BASED_OUTPATIENT_CLINIC_OR_DEPARTMENT_OTHER): Payer: Medicare Other

## 2019-08-14 ENCOUNTER — Other Ambulatory Visit: Payer: Self-pay

## 2019-08-14 ENCOUNTER — Observation Stay (HOSPITAL_BASED_OUTPATIENT_CLINIC_OR_DEPARTMENT_OTHER)
Admission: EM | Admit: 2019-08-14 | Discharge: 2019-08-15 | Disposition: A | Discharge status: Home / Self Care | Payer: Medicare Other | Attending: Cardiovascular Disease | Admitting: Cardiovascular Disease

## 2019-08-14 DIAGNOSIS — Z791 Long term (current) use of non-steroidal anti-inflammatories (NSAID): Secondary | ICD-10-CM | POA: Insufficient documentation

## 2019-08-14 DIAGNOSIS — Z20822 Contact with and (suspected) exposure to covid-19: Secondary | ICD-10-CM | POA: Diagnosis not present

## 2019-08-14 DIAGNOSIS — R079 Chest pain, unspecified: Secondary | ICD-10-CM

## 2019-08-14 DIAGNOSIS — Z79899 Other long term (current) drug therapy: Secondary | ICD-10-CM | POA: Insufficient documentation

## 2019-08-14 DIAGNOSIS — I25119 Atherosclerotic heart disease of native coronary artery with unspecified angina pectoris: Secondary | ICD-10-CM | POA: Diagnosis present

## 2019-08-14 DIAGNOSIS — R9431 Abnormal electrocardiogram [ECG] [EKG]: Secondary | ICD-10-CM | POA: Diagnosis not present

## 2019-08-14 DIAGNOSIS — E785 Hyperlipidemia, unspecified: Secondary | ICD-10-CM | POA: Insufficient documentation

## 2019-08-14 DIAGNOSIS — I10 Essential (primary) hypertension: Secondary | ICD-10-CM | POA: Insufficient documentation

## 2019-08-14 DIAGNOSIS — Z7984 Long term (current) use of oral hypoglycemic drugs: Secondary | ICD-10-CM | POA: Insufficient documentation

## 2019-08-14 DIAGNOSIS — R0789 Other chest pain: Principal | ICD-10-CM | POA: Insufficient documentation

## 2019-08-14 DIAGNOSIS — Z8249 Family history of ischemic heart disease and other diseases of the circulatory system: Secondary | ICD-10-CM | POA: Diagnosis not present

## 2019-08-14 DIAGNOSIS — R6884 Jaw pain: Secondary | ICD-10-CM | POA: Diagnosis not present

## 2019-08-14 DIAGNOSIS — I209 Angina pectoris, unspecified: Secondary | ICD-10-CM

## 2019-08-14 DIAGNOSIS — K219 Gastro-esophageal reflux disease without esophagitis: Secondary | ICD-10-CM | POA: Insufficient documentation

## 2019-08-14 DIAGNOSIS — E119 Type 2 diabetes mellitus without complications: Secondary | ICD-10-CM | POA: Insufficient documentation

## 2019-08-14 LAB — TROPONIN I (HIGH SENSITIVITY)
Troponin I (High Sensitivity): 3 ng/L (ref <18)
Troponin I (High Sensitivity): 2 ng/L (ref <18)

## 2019-08-14 LAB — CBC
HCT: 40.1 % (ref 36.0–46.0)
HCT: 40.7 % (ref 36.0–46.0)
Hemoglobin: 12.5 g/dL (ref 12.0–15.0)
Hemoglobin: 12.7 g/dL (ref 12.0–15.0)
MCH: 25.9 pg — ABNORMAL LOW (ref 26.0–34.0)
MCH: 25.8 pg — ABNORMAL LOW (ref 26.0–34.0)
MCHC: 31.2 g/dL (ref 30.0–36.0)
MCHC: 31.2 g/dL (ref 30.0–36.0)
MCV: 83 fL (ref 80.0–100.0)
MCV: 82.6 fL (ref 80.0–100.0)
Platelets: 294 10*3/uL (ref 150–400)
Platelets: 292 10*3/uL (ref 150–400)
RBC: 4.83 MIL/uL (ref 3.87–5.11)
RBC: 4.93 MIL/uL (ref 3.87–5.11)
RDW: 15.1 % (ref 11.5–15.5)
RDW: 15.1 % (ref 11.5–15.5)
WBC: 4.7 10*3/uL (ref 4.0–10.5)
WBC: 5.7 10*3/uL (ref 4.0–10.5)
nRBC: 0 % (ref 0.0–0.2)
nRBC: 0 % (ref 0.0–0.2)

## 2019-08-14 LAB — BASIC METABOLIC PANEL
Anion gap: 7 (ref 5–15)
BUN: 14 mg/dL (ref 8–23)
CO2: 27 mmol/L (ref 22–32)
Calcium: 9.4 mg/dL (ref 8.9–10.3)
Chloride: 105 mmol/L (ref 98–111)
Creatinine, Ser: 0.72 mg/dL (ref 0.44–1.00)
GFR calc Af Amer: >60 mL/min (ref >60)
GFR calc non Af Amer: >60 mL/min (ref >60)
Glucose, Bld: 141 mg/dL — ABNORMAL HIGH (ref 70–99)
Potassium: 3.4 mmol/L — ABNORMAL LOW (ref 3.5–5.1)
Sodium: 139 mmol/L (ref 135–145)

## 2019-08-14 LAB — HEMOGLOBIN A1C
Hgb A1c MFr Bld: 7.1 % — ABNORMAL HIGH (ref 4.8–5.6)
Mean Plasma Glucose: 157.07 mg/dL

## 2019-08-14 LAB — HEPATIC FUNCTION PANEL
ALT: 25 U/L (ref 0–44)
AST: 22 U/L (ref 15–41)
Albumin: 4.2 g/dL (ref 3.5–5.0)
Alkaline Phosphatase: 78 U/L (ref 38–126)
Bilirubin, Direct: 0.2 mg/dL (ref 0.0–0.2)
Indirect Bilirubin: 0.5 mg/dL (ref 0.3–0.9)
Total Bilirubin: 0.7 mg/dL (ref 0.3–1.2)
Total Protein: 8 g/dL (ref 6.5–8.1)

## 2019-08-14 LAB — GLUCOSE, CAPILLARY: Glucose-Capillary: 111 mg/dL — ABNORMAL HIGH (ref 70–99)

## 2019-08-14 LAB — CREATININE, SERUM
Creatinine, Ser: 0.65 mg/dL (ref 0.44–1.00)
GFR calc Af Amer: >60 mL/min (ref >60)
GFR calc non Af Amer: >60 mL/min (ref >60)

## 2019-08-14 LAB — D-DIMER, QUANTITATIVE: D-Dimer, Quant: 0.31 ug/mL-FEU (ref 0.00–0.50)

## 2019-08-14 LAB — CBG MONITORING, ED
Glucose-Capillary: 135 mg/dL — ABNORMAL HIGH (ref 70–99)
Glucose-Capillary: 121 mg/dL — ABNORMAL HIGH (ref 70–99)

## 2019-08-14 MED ORDER — ASPIRIN EC 81 MG PO TBEC: 81.0000 mg | DELAYED_RELEASE_TABLET | Freq: Every day | ORAL | Status: DC

## 2019-08-14 MED ORDER — ATORVASTATIN CALCIUM 10 MG PO TABS
10.0000 mg | ORAL_TABLET | Freq: Every day | ORAL | Status: DC
  Administered 2019-08-14: 10 mg via ORAL
  Filled 2019-08-14: qty 1

## 2019-08-14 MED ORDER — AMLODIPINE BESYLATE 10 MG PO TABS
10.0000 mg | ORAL_TABLET | Freq: Every day | ORAL | Status: DC
  Administered 2019-08-15: 10 mg via ORAL
  Filled 2019-08-14: qty 1

## 2019-08-14 MED ORDER — ROSUVASTATIN CALCIUM 5 MG PO TABS
5.0000 mg | ORAL_TABLET | Freq: Every day | ORAL | Status: DC
  Filled 2019-08-14: qty 1

## 2019-08-14 MED ORDER — INSULIN ASPART 100 UNIT/ML ~~LOC~~ SOLN
0.0000 [IU] | Freq: Three times a day (TID) | SUBCUTANEOUS | Status: DC
  Administered 2019-08-15: 3 [IU] via SUBCUTANEOUS
  Administered 2019-08-15: 2 [IU] via SUBCUTANEOUS

## 2019-08-14 MED ORDER — ROSUVASTATIN CALCIUM 5 MG PO TABS
5.0000 mg | ORAL_TABLET | Freq: Every day | ORAL | Status: DC
  Administered 2019-08-15: 5 mg via ORAL
  Filled 2019-08-14: qty 1

## 2019-08-14 MED ORDER — NITROGLYCERIN 0.4 MG SL SUBL
0.4000 mg | SUBLINGUAL_TABLET | SUBLINGUAL | Status: DC | PRN
  Administered 2019-08-15: 0.4 mg via SUBLINGUAL

## 2019-08-14 MED ORDER — METFORMIN HCL ER 500 MG PO TB24
500.0000 mg | ORAL_TABLET | Freq: Every day | ORAL | Status: DC
  Filled 2019-08-14: qty 1

## 2019-08-14 MED ORDER — PANTOPRAZOLE SODIUM 40 MG PO TBEC
40.0000 mg | DELAYED_RELEASE_TABLET | Freq: Every day | ORAL | Status: DC
  Administered 2019-08-14 – 2019-08-15 (×2): 40 mg via ORAL
  Filled 2019-08-14 (×2): qty 1

## 2019-08-14 MED ORDER — ASPIRIN EC 81 MG PO TBEC
81.0000 mg | DELAYED_RELEASE_TABLET | Freq: Every day | ORAL | Status: DC
  Administered 2019-08-15: 81 mg via ORAL
  Filled 2019-08-14: qty 1

## 2019-08-14 MED ORDER — HEPARIN SODIUM (PORCINE) 5000 UNIT/ML IJ SOLN
5000.0000 [IU] | Freq: Three times a day (TID) | INTRAMUSCULAR | Status: DC
  Administered 2019-08-15 (×2): 5000 [IU] via SUBCUTANEOUS
  Filled 2019-08-14 (×2): qty 1

## 2019-08-14 MED ORDER — LOSARTAN POTASSIUM 50 MG PO TABS
100.0000 mg | ORAL_TABLET | Freq: Every day | ORAL | Status: DC
  Administered 2019-08-14 – 2019-08-15 (×2): 100 mg via ORAL
  Filled 2019-08-14 (×2): qty 2

## 2019-08-14 MED ORDER — CANAGLIFLOZIN 100 MG PO TABS
100.0000 mg | ORAL_TABLET | Freq: Every day | ORAL | Status: DC
  Filled 2019-08-14: qty 1

## 2019-08-14 MED ORDER — ACETAMINOPHEN 325 MG PO TABS: 650.0000 mg | ORAL_TABLET | ORAL | Status: DC | PRN

## 2019-08-14 MED ORDER — LOSARTAN POTASSIUM-HCTZ 100-25 MG PO TABS: 1.0000 | ORAL_TABLET | Freq: Every day | ORAL | Status: DC

## 2019-08-14 MED ORDER — ENOXAPARIN SODIUM 40 MG/0.4ML ~~LOC~~ SOLN
40.0000 mg | SUBCUTANEOUS | Status: DC
  Administered 2019-08-14: 40 mg via SUBCUTANEOUS
  Filled 2019-08-14: qty 0.4

## 2019-08-14 MED ORDER — EMPAGLIFLOZIN 25 MG PO TABS: 25.0000 mg | ORAL_TABLET | Freq: Every day | ORAL | Status: DC

## 2019-08-14 MED ORDER — ONDANSETRON HCL 4 MG/2ML IJ SOLN: 4.0000 mg | Freq: Four times a day (QID) | INTRAMUSCULAR | Status: DC | PRN

## 2019-08-14 MED ORDER — HYDROCHLOROTHIAZIDE 25 MG PO TABS
25.0000 mg | ORAL_TABLET | Freq: Every day | ORAL | Status: DC
  Administered 2019-08-14 – 2019-08-15 (×2): 25 mg via ORAL
  Filled 2019-08-14 (×3): qty 1

## 2019-08-14 NOTE — ED Notes (Signed)
Bedside handoff report provided to [facility] w/ [facility]. Spoke with receiving unit, agreed report would be rec from [facility] staff due to rec unit in shift handoff reports

## 2019-08-14 NOTE — ED Provider Notes (Signed)
[facility] EMERGENCY DEPARTMENT Provider Note   CSN: [id number] Arrival date & time: 08/14/19  1004     History Chief Complaint  Patient presents with  . Chest Pain    Martha Sherman is a 64 y.o. female with PMHx HTN, diabetes, who presents to the ED today complaining of gradual onset, intermittent, right sided chest pain underneath right breast and radiating upwards x 3-4 days.  She also complains of shortness of breath with these episodes.  She states the episodes last a couple of minutes before dissipating on their own.  Patient states she last had chest pain earlier this morning which resolved after she took 81 mg aspirin.  States chest pain last night resolved after taking 3 baby aspirin.  Reports significant family history for heart disease and MIs.  States her mother passed away in her early 76s and she has other family members on her maternal side that passed away in their 54s and 26s.  Patient states that she has had intermittent chest pain in the past but no history of MIs for her personally.  Recent stress test done 07/04/2017 with no signs of ischemia.  No history of cardiac cath.  Patient also reports that she has been having some intermittent calf tenderness on the left side.  She states that this is mostly present when she ambulates and resolves when she is not ambulating.  Is not currently on blood thinners.  She denies street of DVT/PE.  No recent prolonged travel or immobilization.  No hemoptysis.  No active malignancy.  No exogenous hormone use.   The history is provided by the patient and medical records.       Past Medical History:  Diagnosis Date  . Acid reflux   . Back pain   . Diabetes mellitus   . High cholesterol   . History of Chiari malformation   . Hypertension   . Irregular heart beats    occassional  . Murmur     Patient Active Problem List   Diagnosis Date Noted  . Chest pain due to CAD (HCC) 08/14/2019  . Lumbar pain 04/23/2018  .  Pain in right foot 01/08/2018  . H/O: hysterectomy 08/14/2017  . SUI (stress urinary incontinence, female) 08/14/2017  . Women's annual routine gynecological examination 08/14/2017  . Boil of buttock 07/26/2017  . Chiari malformation type I (HCC) 07/04/2015  . Essential hypertension 01/19/2015  . Right arm pain 01/19/2015  . DM (diabetes mellitus) (HCC) 01/19/2015  . Cough 01/18/2015  . Shortness of breath 01/18/2015  . Pain of right heel 02/19/2014  . Plantar fasciitis of right foot 02/19/2014  . Metatarsal deformity 02/19/2014    Past Surgical History:  Procedure Laterality Date  . ABDOMINAL HYSTERECTOMY  1993   partial  . BACK SURGERY  2009     OB History   No obstetric history on file.     Family History  Problem Relation Age of Onset  . Heart attack Mother   . Cancer Father   . Alzheimer's disease Father   . Hypertension Sister   . Hypertension Brother     Social History   Tobacco Use  . Smoking status: Never Smoker  . Smokeless tobacco: Never Used  Substance Use Topics  . Alcohol use: No  . Drug use: No    Home Medications Prior to Admission medications   Medication Sig Start Date End Date Taking? Authorizing Provider  amLODipine (NORVASC) 10 MG tablet Take 10 mg by mouth daily.  Yes [provider]  aspirin EC 81 MG tablet Take 81 mg by mouth.   Yes [provider]  atorvastatin (LIPITOR) 10 MG tablet atorvastatin 10 mg tablet   Yes [provider]  metFORMIN (GLUCOPHAGE-XR) 500 MG 24 hr tablet  09/23/18  Yes [provider]  metFORMIN (GLUMETZA) 500 MG (MOD) 24 hr tablet Take 500 mg by mouth 2 (two) times daily with a meal.     Yes [provider]  omeprazole (PRILOSEC) 20 MG capsule Take 20 mg by mouth.   Yes [provider]  omeprazole (PRILOSEC) 40 MG capsule Take by mouth daily. 06/10/18  Yes [provider]  OneTouch Delica Lancets 36R MISC USE TO TEST BLOOD SUGAR 3 TIMES DAILY 06/10/18   Yes [provider]  ONETOUCH VERIO test strip  09/12/18  Yes [provider]  rosuvastatin (CRESTOR) 5 MG tablet  09/12/18  Yes [provider]  Vitamin D, Ergocalciferol, (DRISDOL) 1.25 MG (50000 UT) CAPS capsule Take by mouth. 08/13/16  Yes [provider]  acetaminophen (TYLENOL) 500 MG tablet Take 500 mg by mouth as needed. For pain     [provider]  cephALEXin (KEFLEX) 500 MG capsule cephalexin 500 mg capsule    [provider]  diclofenac (VOLTAREN) 75 MG EC tablet Take by mouth. 07/31/16   [provider]  diclofenac (VOLTAREN) 75 MG EC tablet Take 1 tablet (75 mg total) by mouth 2 (two) times daily. 05/23/18   Wallene Huh, DPM  diclofenac (VOLTAREN) 75 MG EC tablet Take 1 tablet (75 mg total) by mouth 2 (two) times daily. 09/26/18   Wallene Huh, DPM  diclofenac (VOLTAREN) 75 MG EC tablet Take 1 tablet (75 mg total) by mouth 2 (two) times daily. 12/10/18   Wallene Huh, DPM  Dulaglutide (TRULICITY) 4.43 XV/4.0GQ SOPN Trulicity 6.76 PP/5.0 mL subcutaneous pen injector    [provider]  empagliflozin (JARDIANCE) 25 MG TABS tablet Take by mouth. 08/27/16   [provider]  fluconazole (DIFLUCAN) 100 MG tablet fluconazole 100 mg tablet 07/26/17   [provider]  gabapentin (NEURONTIN) 300 MG capsule gabapentin 300 mg capsule 07/31/16   [provider]  HYDROcodone-acetaminophen (NORCO) 5-325 MG per tablet Take 0.5 tablets by mouth as needed. For pain     [provider]  hydrocortisone (PROCTOSOL HC) 2.5 % rectal cream Proctosol HC 2.5 % topical cream perineal applicator  APPLY RECTALLY 3 TIMES A DAY    [provider]  ibuprofen (ADVIL,MOTRIN) 800 MG tablet Take 800 mg by mouth every 8 (eight) hours as needed.    [provider]  losartan-hydrochlorothiazide (HYZAAR) 100-25 MG per tablet Take 1 tablet by mouth daily.      [provider]  Magnesium 250 MG  TABS Take 1 tablet by mouth daily. 06/10/18   [provider]  methocarbamol (ROBAXIN) 500 MG tablet TAKE 1 TABLET BY MOUTH FOUR TIMES A DAY AS NEEDED 06/26/18   [provider]  methylPREDNISolone (MEDROL DOSEPAK) 4 MG TBPK tablet TAKE 6 TABLETS ON DAY 1 AS DIRECTED ON PACKAGE AND DECREASE BY 1 TAB EACH DAY FOR A TOTAL OF 6 DAYS 06/25/18   [provider]  metoCLOPramide (REGLAN) 5 MG tablet metoclopramide 5 mg tablet    [provider]  prednisoLONE acetate (PRED FORTE) 1 % ophthalmic suspension prednisolone acetate 1 % eye drops,suspension    [provider]  predniSONE (STERAPRED UNI-PAK 21 TAB) 5 MG (21) TBPK  tablet TAKE 6 TABLETS ON DAY 1 AS DIRECTED ON PACKAGE AND DECREASE BY 1 TAB EACH DAY FOR A TOTAL OF 6 DAYS 04/23/18   [provider]  pregabalin (LYRICA) 75 MG capsule Take 1 capsule (75 mg total) by mouth 2 (two) times daily. 07/05/15   Anson Fret, MD  promethazine (PHENERGAN) 12.5 MG tablet promethazine 12.5 mg tablet    [provider]  sulfamethoxazole-trimethoprim (BACTRIM DS,SEPTRA DS) 800-160 MG tablet sulfamethoxazole 800 mg-trimethoprim 160 mg tablet 07/26/17   [provider]  tiZANidine (ZANAFLEX) 2 MG tablet Take 1 tablet (2 mg total) by mouth every 6 (six) hours as needed for muscle spasms. 06/05/18   Alvira Monday, MD  traMADol (ULTRAM) 50 MG tablet tramadol 50 mg tablet    [provider]    Allergies    Patient has no known allergies.  Review of Systems   Review of Systems  Constitutional: Negative for chills and fever.  Respiratory: Positive for shortness of breath.   Cardiovascular: Positive for chest pain. Negative for palpitations and leg swelling.  Gastrointestinal: Negative for abdominal pain, constipation, diarrhea, nausea and vomiting.  Musculoskeletal: Positive for arthralgias.  All other systems reviewed and are negative.   Physical Exam Updated Vital Signs BP (!)  196/94 (BP Location: Right Arm)   Temp 98.7 F (37.1 C) (Oral)   Resp 16   Ht 5\' 7"  (1.702 m)   Wt 83 kg   SpO2 100%   BMI 28.66 kg/m   Physical Exam Vitals and nursing note reviewed.  Constitutional:      Appearance: She is not ill-appearing or diaphoretic.  HENT:     Head: Normocephalic and atraumatic.  Eyes:     Conjunctiva/sclera: Conjunctivae normal.  Cardiovascular:     Rate and Rhythm: Normal rate and regular rhythm.     Pulses:          Radial pulses are 2+ on the right side and 2+ on the left side.       Dorsalis pedis pulses are 2+ on the right side and 2+ on the left side.     Heart sounds: Normal heart sounds.  Pulmonary:     Effort: Pulmonary effort is normal.     Breath sounds: Normal breath sounds. No decreased breath sounds, wheezing, rhonchi or rales.  Abdominal:     Palpations: Abdomen is soft.     Tenderness: There is no abdominal tenderness. There is no guarding or rebound.  Musculoskeletal:     Cervical back: Neck supple.     Right lower leg: No tenderness. No edema.     Left lower leg: No tenderness. No edema.     Comments: No edema to BLEs. No tenderness to palpation to left calf although pt points to this area and reports she has been having intermittent pains. 2+ DP pulse.   Skin:    General: Skin is warm and dry.  Neurological:     Mental Status: She is alert.     ED Results / Procedures / Treatments   Labs (all labs ordered are listed, but only abnormal results are displayed) Labs Reviewed  BASIC METABOLIC PANEL - Abnormal; Notable for the following components:      Result Value   Potassium 3.4 (*)    Glucose, Bld 141 (*)    All other components within normal limits  CBC - Abnormal; Notable for the following components:   MCH 25.9 (*)    All other components within normal limits  CBG MONITORING, ED - Abnormal; Notable for the following components:   Glucose-Capillary 135 (*)    All other components within normal limits  SARS  CORONAVIRUS 2 (TAT 6-24 HRS)  D-DIMER, QUANTITATIVE (NOT AT [facility])  HEPATIC FUNCTION PANEL  TROPONIN I (HIGH SENSITIVITY)  TROPONIN I (HIGH SENSITIVITY)    EKG EKG Interpretation  Date/Time:  Friday August 14 2019 10:20:21 EST Ventricular Rate:  88 PR Interval:    QRS Duration: 90 QT Interval:  464 QTC Calculation: 562 R Axis:   58 Text Interpretation: Sinus rhythm Probable left atrial enlargement Borderline repolarization abnormality subtle inferior and lateral ST depression compared to previous Confirmed by Arby Barrette [phone]) on 08/14/2019 1:32:56 PM   Radiology US Venous Img Lower  Left (DVT Study)  Result Date: 08/14/2019 CLINICAL DATA:  Left leg pain and swelling EXAM: LEFT LOWER EXTREMITY VENOUS DOPPLER ULTRASOUND TECHNIQUE: Gray-scale sonography with graded compression, as well as color Doppler and duplex ultrasound were performed to evaluate the lower extremity deep venous systems from the level of the common femoral vein and including the common femoral, femoral, profunda femoral, popliteal and calf veins including the posterior tibial, peroneal and gastrocnemius veins when visible. The superficial great saphenous vein was also interrogated. Spectral Doppler was utilized to evaluate flow at rest and with distal augmentation maneuvers in the common femoral, femoral and popliteal veins. COMPARISON:  None. FINDINGS: Contralateral Common Femoral Vein: Respiratory phasicity is normal and symmetric with the symptomatic side. No evidence of thrombus. Normal compressibility. Common Femoral Vein: No evidence of thrombus. Normal compressibility, respiratory phasicity and response to augmentation. Saphenofemoral Junction: No evidence of thrombus. Normal compressibility and flow on color Doppler imaging. Profunda Femoral Vein: No evidence of thrombus. Normal compressibility and flow on color Doppler imaging. Femoral Vein: No evidence of thrombus. Normal compressibility, respiratory phasicity  and response to augmentation. Popliteal Vein: No evidence of thrombus. Normal compressibility, respiratory phasicity and response to augmentation. Calf Veins: No evidence of thrombus. Normal compressibility and flow on color Doppler imaging. Superficial Great Saphenous Vein: No evidence of thrombus. Normal compressibility. Venous Reflux:  None. Other Findings:  None. IMPRESSION: No evidence of deep venous thrombosis. Electronically Signed   By: Alcide Clever M.D.   On: 08/14/2019 11:18   DG Chest Portable 1 View  Result Date: 08/14/2019 CLINICAL DATA:  Right-sided chest pain with dizziness for 3 days EXAM: PORTABLE CHEST 1 VIEW COMPARISON:  None. FINDINGS: The heart size and mediastinal contours are within normal limits. Both lungs are clear. The visualized skeletal structures are unremarkable. IMPRESSION: No active disease. Electronically Signed   By: Elige Ko   On: 08/14/2019 10:45    Procedures Procedures (including critical care time)  Medications Ordered in ED Medications - No data to display  ED Course  I have reviewed the triage vital signs and the nursing notes.  Pertinent labs & imaging results that were available during my care of the patient were reviewed by me and considered in my medical decision making (see chart for details).  64 year old female who presents the ED today with complaints of intermittent right-sided chest pain for the past 3 to 4 days with shortness of breath.  She is also describing symptoms of pain to her left calf however this sounds more like claudication to me.  She is not currently on any blood thinners, cannot rule out with PERC.  Will obtain ultrasound study at this time and D-dimer and work-up for ACS.  Patient does have risk factors for ACS including  family history, diabetes, high blood pressure.  Not having any active chest pain at this time.  She states she took a baby aspirin earlier today which resolved her chest pain.  She has equal pulses, doubt  dissection.  Arrival to the ED patient is afebrile, nontachycardic, nontachypneic.  Blood pressure elevated at 196/94 however patient states she just took her blood pressure medication prior to arrival.  We will continue to monitor.  CBC without leukocytosis.  Hemoglobin is stable.  No electrolyte abnormalities on BMP.  D-dimer -0.31.  LFTs within normal limits.  Initial troponin of 3, will repeat.  Chest x-ray negative at this time.  DVT study negative.  Pressures have been more stable, most recent 149/73.  A troponin of 2.  Patient does have heart score of 5 however low risk for ACS at this time with downtrending troponins. Her EKG does show some signs of ischemia however; given this will consult cardiologist. Have attempted to get into touch with pt's cardiologist Dr. Reuel Boomaniel however unable to get a hold of him as [facility]   Discussed case with Dr. Royann Shiversroitoru who evaluated patient's EKG; agrees looks abnormal. Recommends admission for further eval; agrees to admit. Will place admission orders and swab for COVID.   Clinical Course as of Aug 13 1457  Fri Aug 14, 2019  1114 D-Dimer, Quant: 0.31 [MV]  1121 Troponin I (High Sensitivity): 3 [MV]    Clinical Course User Index [MV] Tanda RockersVenter, Ramy Greth, PA-C   MDM Rules/Calculators/A&P                       Final Clinical Impression(s) / ED Diagnoses Final diagnoses:  Chest pain, unspecified type  EKG abnormalities    Rx / DC Orders ED Discharge Orders    None       Tanda RockersVenter, Dhwani Venkatesh, PA-C 08/14/19 1459    Arby BarrettePfeiffer, Marcy, MD 08/17/19 [phone]

## 2019-08-14 NOTE — ED Triage Notes (Signed)
Presents with chest pain for the past 3 days, rt chest, up to neck area, and has had some dizziness at well

## 2019-08-14 NOTE — ED Triage Notes (Signed)
Immediately placed on cont cardiac monitoring with cont POX, PA in room interviewing pt

## 2019-08-14 NOTE — ED Notes (Signed)
Pt on monitor 

## 2019-08-14 NOTE — H&P (Signed)
Cardiology Admission History and Physical:   Patient ID: Martha Sherman MRN: [id number]; DOB: 06/06/1956   Admission date: 08/14/2019  Primary Care Provider: Caffie Damme, MD Primary Cardiologist: No primary care provider on file.  Primary Electrophysiologist:  None   Chief Complaint:  Chest pain  Patient Profile:   Martha Sherman is a 64 y.o. female with HTN, HL, DMII, strong family history of CAD who presents with chest pain.  History of Present Illness:   Martha Sherman with HTN, HL, DMII, strong family history of CAD who presents with chest pain.  The patient reports that she has had intermittent episodes of right sided, dull chest pain underneath her right breast for quite some time. However, in the past three days, she has had 2-3 episodes per day. Also now with intermittent bilateral jaw pain, that sometimes occurs at the same time as the chest pain. Episodes generally occur while she is lying down and not associated with exertion. Reports chest discomfort will last for approximately 5 minutes, during which time she tries to be calm and relax and has been taking aspirin. Not associated with shortness of breath, nausea, diaphoresis. She reports no routine exercise but does help clean rental units between tenants and does not have chest pain with this exertion. She denies orthopnea, PND, increase in LEE. Does report occasional dizziness in the last few days but no syncope.  Given these symptoms, as well a strong family history - mother with MI and multiple family members with CAD - the patient presented to the ED today. In the ED, ECG showed slight inferior ST depression and TWI which were new from prior ECG. Laboratory results with normal creatinine and troponin levels 3 --> 2. D-dimer 0.31 and LE u/s negative for DVT. Reportedly the patient had a stress test in 2018 which was unremarkable. Given her ECG abnormalities she was subsequently transferred for further management. Upon my  evaluation, she reports being chest pain free. She overall feels well and denies any systemic symptoms or complaints beyond the above.   Heart Pathway Score:  HEAR Score: 4  Past Medical History:  Diagnosis Date  . Acid reflux   . Back pain   . Diabetes mellitus   . High cholesterol   . History of Chiari malformation   . Hypertension   . Irregular heart beats    occassional  . Murmur     Past Surgical History:  Procedure Laterality Date  . ABDOMINAL HYSTERECTOMY  1993   partial  . BACK SURGERY  2009     Medications Prior to Admission: Prior to Admission medications   Medication Sig Start Date End Date Taking? Authorizing Provider  acetaminophen (TYLENOL) 500 MG tablet Take 500 mg by mouth as needed. For pain    Yes [provider]  albuterol (VENTOLIN HFA) 108 (90 Base) MCG/ACT inhaler Inhale 2 puffs into the lungs 4 (four) times daily as needed for wheezing. 05/23/19  Yes [provider]  amLODipine (NORVASC) 10 MG tablet Take 10 mg by mouth daily.   Yes [provider]  aspirin EC 81 MG tablet Take 81 mg by mouth.   Yes [provider]  Dulaglutide (TRULICITY) 0.75 MG/0.5ML SOPN Inject 0.75 mg into the skin once a week. Tuesdays   Yes [provider]  gabapentin (NEURONTIN) 300 MG capsule Take 300 mg by mouth as needed (pain).  07/31/16  Yes [provider]  losartan-hydrochlorothiazide (HYZAAR) 100-25 MG per tablet Take 1 tablet by mouth daily.  Yes [provider]  metFORMIN (GLUCOPHAGE-XR) 500 MG 24 hr tablet Take 500 mg by mouth daily.  09/23/18  Yes [provider]  omeprazole (PRILOSEC) 20 MG capsule Take 20 mg by mouth daily as needed (acid reflux).    Yes [provider]  [facility] VERIO test strip  09/12/18  Yes [provider]  rosuvastatin (CRESTOR) 5 MG tablet  09/12/18  Yes [provider]  Vitamin D, Ergocalciferol, (DRISDOL) 1.25 MG (50000 UT) CAPS capsule Take 50,000  Units by mouth every 7 (seven) days.  08/13/16  Yes [provider]  diclofenac (VOLTAREN) 75 MG EC tablet Take 1 tablet (75 mg total) by mouth 2 (two) times daily. Patient not taking: Reported on 08/14/2019 05/23/18   Lenn Sink, DPM  diclofenac (VOLTAREN) 75 MG EC tablet Take 1 tablet (75 mg total) by mouth 2 (two) times daily. Patient not taking: Reported on 08/14/2019 09/26/18   Lenn Sink, DPM  diclofenac (VOLTAREN) 75 MG EC tablet Take 1 tablet (75 mg total) by mouth 2 (two) times daily. Patient not taking: Reported on 08/14/2019 12/10/18   Lenn Sink, DPM  pregabalin (LYRICA) 75 MG capsule Take 1 capsule (75 mg total) by mouth 2 (two) times daily. Patient not taking: Reported on 08/14/2019 07/05/15   Anson Fret, MD  tiZANidine (ZANAFLEX) 2 MG tablet Take 1 tablet (2 mg total) by mouth every 6 (six) hours as needed for muscle spasms. Patient not taking: Reported on 08/14/2019 06/05/18   Alvira Monday, MD     Allergies:   No Known Allergies  Social History:   Social History   Socioeconomic History  . Marital status: Married    Spouse name: [name]  . Number of children: 1  . Years of education: 89  . Highest education level: Not on file  Occupational History  . Occupation: retired    Comment: sherriff's office, jail  Tobacco Use  . Smoking status: Never Smoker  . Smokeless tobacco: Never Used  Substance and Sexual Activity  . Alcohol use: No  . Drug use: No  . Sexual activity: Yes    Birth control/protection: Surgical  Other Topics Concern  . Not on file  Social History Narrative   Lives at home with husband   Caffeine use- sodas, 3 a day   Social Determinants of Health   Financial Resource Strain:   . Difficulty of Paying Living Expenses: Not on file  Food Insecurity:   . Worried About Programme researcher, broadcasting/film/video in the Last Year: Not on file  . Ran Out of Food in the Last Year: Not on file  Transportation Needs:   . Lack of Transportation  (Medical): Not on file  . Lack of Transportation (Non-Medical): Not on file  Physical Activity:   . Days of Exercise per Week: Not on file  . Minutes of Exercise per Session: Not on file  Stress:   . Feeling of Stress : Not on file  Social Connections:   . Frequency of Communication with Friends and Family: Not on file  . Frequency of Social Gatherings with Friends and Family: Not on file  . Attends Religious Services: Not on file  . Active Member of Clubs or Organizations: Not on file  . Attends Banker Meetings: Not on file  . Marital Status: Not on file  Intimate Partner Violence:   . Fear of Current or Ex-Partner: Not on file  . Emotionally Abused: Not on file  .  Physically Abused: Not on file  . Sexually Abused: Not on file    Family History:   The patient's family history includes Alzheimer's disease in her father; Cancer in her father; Heart attack in her mother; Hypertension in her brother and sister.    ROS:  Please see the history of present illness.  All other ROS reviewed and negative.     Physical Exam/Data:   Vitals:   08/14/19 1430 08/14/19 1600 08/14/19 1739 08/14/19 2007  BP: (!) 173/92 (!) 177/82 (!) 164/84 (!) 178/87  Pulse: (!) 104 81 88 86  Resp: (!) 21 18 18 20   Temp:    98.9 F (37.2 C)  TempSrc:    Oral  SpO2: 98% 96% 95% 98%  Weight:    83.3 kg  Height:    5' 7.5" (1.715 m)   No intake or output data in the 24 hours ending 08/14/19 2247 Last 3 Weights 08/14/2019 08/14/2019 06/05/2018  Weight (lbs) 183 lb 9.6 oz 183 lb 188 lb  Weight (kg) 83.28 kg 83.008 kg 85.276 kg     Body mass index is 28.33 kg/m.  General:  Well nourished, well developed, in no acute distress HEENT: normal Lymph: no adenopathy Neck: no JVD Endocrine:  No thryomegaly Vascular: No carotid bruits; FA pulses 2+ bilaterally without bruits  Cardiac:  normal S1, S2; RRR; no murmur  Lungs:  clear to auscultation bilaterally, no wheezing, rhonchi or rales  Abd:  soft, nontender, no hepatomegaly  Ext: no edema Musculoskeletal:  No deformities, BUE and BLE strength normal and equal Skin: warm and dry  Neuro:  CNs 2-12 intact, no focal abnormalities noted Psych:  Normal affect    EKG:  The ECG that was done 08/14/19 was personally reviewed and demonstrates sinus rhythm with inferior ST depression and TWI which are new from prior.   Relevant CV Studies: N/A  Laboratory Data:  High Sensitivity Troponin:   Recent Labs  Lab 08/14/19 1030 08/14/19 1228  TROPONINIHS 3 2      Chemistry Recent Labs  Lab 08/14/19 1030  NA 139  K 3.4*  CL 105  CO2 27  GLUCOSE 141*  BUN 14  CREATININE 0.72  CALCIUM 9.4  GFRNONAA >60  GFRAA >60  ANIONGAP 7    Recent Labs  Lab 08/14/19 1030  PROT 8.0  ALBUMIN 4.2  AST 22  ALT 25  ALKPHOS 78  BILITOT 0.7   Hematology Recent Labs  Lab 08/14/19 1030  WBC 4.7  RBC 4.83  HGB 12.5  HCT 40.1  MCV 83.0  MCH 25.9*  MCHC 31.2  RDW 15.1  PLT 294   BNPNo results for input(s): BNP, PROBNP in the last 168 hours.  DDimer  Recent Labs  Lab 08/14/19 1030  DDIMER 0.31     Radiology/Studies:  08/16/19 Venous Img Lower  Left (DVT Study)  Result Date: 08/14/2019 CLINICAL DATA:  Left leg pain and swelling EXAM: LEFT LOWER EXTREMITY VENOUS DOPPLER ULTRASOUND TECHNIQUE: Gray-scale sonography with graded compression, as well as color Doppler and duplex ultrasound were performed to evaluate the lower extremity deep venous systems from the level of the common femoral vein and including the common femoral, femoral, profunda femoral, popliteal and calf veins including the posterior tibial, peroneal and gastrocnemius veins when visible. The superficial great saphenous vein was also interrogated. Spectral Doppler was utilized to evaluate flow at rest and with distal augmentation maneuvers in the common femoral, femoral and popliteal veins. COMPARISON:  None. FINDINGS: Contralateral Common Femoral  Vein: Respiratory  phasicity is normal and symmetric with the symptomatic side. No evidence of thrombus. Normal compressibility. Common Femoral Vein: No evidence of thrombus. Normal compressibility, respiratory phasicity and response to augmentation. Saphenofemoral Junction: No evidence of thrombus. Normal compressibility and flow on color Doppler imaging. Profunda Femoral Vein: No evidence of thrombus. Normal compressibility and flow on color Doppler imaging. Femoral Vein: No evidence of thrombus. Normal compressibility, respiratory phasicity and response to augmentation. Popliteal Vein: No evidence of thrombus. Normal compressibility, respiratory phasicity and response to augmentation. Calf Veins: No evidence of thrombus. Normal compressibility and flow on color Doppler imaging. Superficial Great Saphenous Vein: No evidence of thrombus. Normal compressibility. Venous Reflux:  None. Other Findings:  None. IMPRESSION: No evidence of deep venous thrombosis. Electronically Signed   By: Inez Catalina M.D.   On: 08/14/2019 11:18   DG Chest Portable 1 View  Result Date: 08/14/2019 CLINICAL DATA:  Right-sided chest pain with dizziness for 3 days EXAM: PORTABLE CHEST 1 VIEW COMPARISON:  None. FINDINGS: The heart size and mediastinal contours are within normal limits. Both lungs are clear. The visualized skeletal structures are unremarkable. IMPRESSION: No active disease. Electronically Signed   By: Kathreen Devoid   On: 08/14/2019 10:45     HEAR Score (for undifferentiated chest pain):  HEAR Score: 4    Assessment and Plan:  Martha Sherman is a 64 y.o. female with HTN, HL, DMII, strong family history of CAD who presents with chest pain.  # Chest pain Patient presents with right sided chest pain and intermittent jaw pain for the last three days. Atypical in description and has only occurred at rest. However, does have risk factors for CAD including HTN, HL, DMII and family history. ECG also with new TWI and slight ST depression from prior.  Troponin levels reassuring at 3, 2.  - ASA 81 mg daily - Cont home crestor - lipid panel, A1C pending - Will plan for stress test vs coronary CT in the morning for further assessment - SL NG prn but currently chest pain free  # HTN Blood pressure elevated intermittently, may need further titration of home BP meds.  - Cont home amlodipine - Cont home HCTZ - Cont home losartan  # HL - Lipid panel pending - crestor as above  # DMII - A1C pending - hold home oral medications while inpatient - Insulin sliding scale, accuchecks  # FEN - Heart healthy, modified carbohydrate diet  # FULL CODE  Severity of Illness: The appropriate patient status for this patient is OBSERVATION. Observation status is judged to be reasonable and necessary in order to provide the required intensity of service to ensure the patient's safety. The patient's presenting symptoms, physical exam findings, and initial radiographic and laboratory data in the context of their medical condition is felt to place them at decreased risk for further clinical deterioration. Furthermore, it is anticipated that the patient will be medically stable for discharge from the hospital within 2 midnights of admission. The following factors support the patient status of observation.   " The patient's presenting symptoms include chest pain. " The physical exam findings include no significant abnormalities. " The initial radiographic and laboratory data are significant for abnormal ECG.  For questions or updates, please contact [place] Please consult [email or web address] for contact info under    Signed, Bryna Colander, MD  08/14/2019 10:47 PM

## 2019-08-15 ENCOUNTER — Observation Stay (HOSPITAL_COMMUNITY): Payer: Medicare Other

## 2019-08-15 ENCOUNTER — Other Ambulatory Visit: Payer: Self-pay

## 2019-08-15 ENCOUNTER — Observation Stay (HOSPITAL_BASED_OUTPATIENT_CLINIC_OR_DEPARTMENT_OTHER): Payer: Medicare Other

## 2019-08-15 DIAGNOSIS — E785 Hyperlipidemia, unspecified: Secondary | ICD-10-CM | POA: Diagnosis not present

## 2019-08-15 DIAGNOSIS — I10 Essential (primary) hypertension: Secondary | ICD-10-CM | POA: Diagnosis not present

## 2019-08-15 DIAGNOSIS — Z20822 Contact with and (suspected) exposure to covid-19: Secondary | ICD-10-CM | POA: Diagnosis not present

## 2019-08-15 DIAGNOSIS — R9431 Abnormal electrocardiogram [ECG] [EKG]: Secondary | ICD-10-CM | POA: Diagnosis not present

## 2019-08-15 DIAGNOSIS — R079 Chest pain, unspecified: Secondary | ICD-10-CM

## 2019-08-15 DIAGNOSIS — I25119 Atherosclerotic heart disease of native coronary artery with unspecified angina pectoris: Secondary | ICD-10-CM | POA: Diagnosis present

## 2019-08-15 DIAGNOSIS — E119 Type 2 diabetes mellitus without complications: Secondary | ICD-10-CM | POA: Diagnosis not present

## 2019-08-15 DIAGNOSIS — R0789 Other chest pain: Secondary | ICD-10-CM | POA: Diagnosis not present

## 2019-08-15 DIAGNOSIS — I209 Angina pectoris, unspecified: Secondary | ICD-10-CM

## 2019-08-15 DIAGNOSIS — Z8249 Family history of ischemic heart disease and other diseases of the circulatory system: Secondary | ICD-10-CM

## 2019-08-15 LAB — GLUCOSE, CAPILLARY
Glucose-Capillary: 137 mg/dL — ABNORMAL HIGH (ref 70–99)
Glucose-Capillary: 177 mg/dL — ABNORMAL HIGH (ref 70–99)
Glucose-Capillary: 118 mg/dL — ABNORMAL HIGH (ref 70–99)

## 2019-08-15 LAB — ECHOCARDIOGRAM COMPLETE
Height: 67.5 in
Weight: 2939.2 oz

## 2019-08-15 LAB — BASIC METABOLIC PANEL
Anion gap: 10 (ref 5–15)
BUN: 9 mg/dL (ref 8–23)
CO2: 26 mmol/L (ref 22–32)
Calcium: 9.2 mg/dL (ref 8.9–10.3)
Chloride: 105 mmol/L (ref 98–111)
Creatinine, Ser: 0.77 mg/dL (ref 0.44–1.00)
GFR calc Af Amer: >60 mL/min (ref >60)
GFR calc non Af Amer: >60 mL/min (ref >60)
Glucose, Bld: 108 mg/dL — ABNORMAL HIGH (ref 70–99)
Potassium: 3.7 mmol/L (ref 3.5–5.1)
Sodium: 141 mmol/L (ref 135–145)

## 2019-08-15 LAB — CBC
HCT: 38.7 % (ref 36.0–46.0)
Hemoglobin: 12.3 g/dL (ref 12.0–15.0)
MCH: 26.1 pg (ref 26.0–34.0)
MCHC: 31.8 g/dL (ref 30.0–36.0)
MCV: 82 fL (ref 80.0–100.0)
Platelets: 289 10*3/uL (ref 150–400)
RBC: 4.72 MIL/uL (ref 3.87–5.11)
RDW: 15.1 % (ref 11.5–15.5)
WBC: 5.3 10*3/uL (ref 4.0–10.5)
nRBC: 0 % (ref 0.0–0.2)

## 2019-08-15 LAB — LIPID PANEL
Cholesterol: 157 mg/dL (ref 0–200)
HDL: 74 mg/dL (ref >40)
LDL Cholesterol: 72 mg/dL (ref 0–99)
Total CHOL/HDL Ratio: 2.1 RATIO
Triglycerides: 57 mg/dL (ref <150)
VLDL: 11 mg/dL (ref 0–40)

## 2019-08-15 LAB — SARS CORONAVIRUS 2 (TAT 6-24 HRS): SARS Coronavirus 2: NEGATIVE

## 2019-08-15 LAB — HEMOGLOBIN A1C
Hgb A1c MFr Bld: 7.1 % — ABNORMAL HIGH (ref 4.8–5.6)
Mean Plasma Glucose: 157.07 mg/dL

## 2019-08-15 LAB — HIV ANTIBODY (ROUTINE TESTING W REFLEX): HIV Screen 4th Generation wRfx: NONREACTIVE

## 2019-08-15 MED ORDER — METOPROLOL TARTRATE 50 MG PO TABS
50.0000 mg | ORAL_TABLET | ORAL | Status: AC
  Administered 2019-08-15: 50 mg via ORAL
  Filled 2019-08-15: qty 1

## 2019-08-15 MED ORDER — NITROGLYCERIN 0.4 MG SL SUBL
SUBLINGUAL_TABLET | SUBLINGUAL | Status: AC
  Filled 2019-08-15: qty 2

## 2019-08-15 MED ORDER — IOHEXOL 350 MG/ML SOLN
80.0000 mL | Freq: Once | INTRAVENOUS | Status: AC | PRN
  Administered 2019-08-15: 80 mL via INTRAVENOUS

## 2019-08-15 MED ORDER — NITROGLYCERIN 0.4 MG SL SUBL: 0.4000 mg | SUBLINGUAL_TABLET | SUBLINGUAL | Status: DC

## 2019-08-15 NOTE — Care Management Obs Status (Signed)
MEDICARE OBSERVATION STATUS NOTIFICATION   Patient Details  Name: Martha Sherman MRN: [id number] Date of Birth: 1956-07-11   Medicare Observation Status Notification Given:  Yes    Deveron Furlong, RN 08/15/2019, 12:33 PM

## 2019-08-15 NOTE — Care Management CC44 (Signed)
Condition Code 44 Documentation Completed  Patient Details  Name: Dhrithi Riche MRN: [id number] Date of Birth: [birth year]   Condition Code 44 given:  Yes Patient signature on Condition Code 44 notice:  Yes Documentation of 2 MD's agreement:  Yes Code 44 added to claim:  Yes    Deveron Furlong, RN 08/15/2019, 12:33 PM

## 2019-08-15 NOTE — Plan of Care (Signed)
  Problem: Education: Goal: Knowledge of General Education information will improve Description: Including pain rating scale, medication(s)/side effects and non-pharmacologic comfort measures Outcome: Adequate for Discharge   Problem: Health Behavior/Discharge Planning: Goal: Ability to manage health-related needs will improve Outcome: Adequate for Discharge   Problem: Clinical Measurements: Goal: Ability to maintain clinical measurements within normal limits will improve Outcome: Adequate for Discharge Goal: Diagnostic test results will improve Outcome: Adequate for Discharge Goal: Cardiovascular complication will be avoided Outcome: Adequate for Discharge   Problem: Activity: Goal: Risk for activity intolerance will decrease Outcome: Adequate for Discharge   Problem: Nutrition: Goal: Adequate nutrition will be maintained Outcome: Adequate for Discharge   Problem: Coping: Goal: Level of anxiety will decrease Outcome: Adequate for Discharge   Problem: Elimination: Goal: Will not experience complications related to bowel motility Outcome: Adequate for Discharge Goal: Will not experience complications related to urinary retention Outcome: Adequate for Discharge   Problem: Pain Managment: Goal: General experience of comfort will improve Outcome: Adequate for Discharge   Problem: Safety: Goal: Ability to remain free from injury will improve Outcome: Adequate for Discharge   Problem: Skin Integrity: Goal: Risk for impaired skin integrity will decrease Outcome: Adequate for Discharge

## 2019-08-15 NOTE — Discharge Summary (Signed)
Discharge Summary    Patient ID: Martha Sherman MRN: [id number]; DOB: 1955/09/21  Admit date: 08/14/2019 Discharge date: 08/15/2019  Primary Care Provider: Caffie Damme, MD  Primary Cardiologist: Martha Docker, MD   Discharge Diagnoses    Active Problems:   Essential hypertension   DM (diabetes mellitus) (HCC)   Chest pain   Dyslipidemia   Hyperlipidemia LDL goal <70   Family history of premature CAD   Chest pain due to CAD [facility])    Diagnostic Studies/Procedures    Coronary CT IMPRESSION: 1. No evidence of CAD, CADRADS = 0.  2. Coronary calcium score of 0. This was 0 percentile for age and sex matched control.  3. Normal coronary origin with right dominance.  4. Mildly dilated main pulmonary artery, 32 mm may suggest increased pulmonary artery pressure.  5. Mild concentric left ventricular hypertrophy, 12 mm wall thickness.   Echo 08/15/19  1. Left ventricular ejection fraction, by visual estimation, is 60 to 65%. The left ventricle has normal function. There is no left ventricular hypertrophy.  2. Left ventricular diastolic parameters are consistent with Grade I diastolic dysfunction (impaired relaxation).  3. The left ventricle has no regional wall motion abnormalities.  4. Global right ventricle has normal systolic function.The right ventricular size is normal.  5. Left atrial size was normal.  6. Right atrial size was normal.  7. Trivial pericardial effusion is present.  8. Mild mitral annular calcification.  9. The mitral valve is normal in structure. Trivial mitral valve regurgitation. No evidence of mitral stenosis. 10. The tricuspid valve is normal in structure. Tricuspid valve regurgitation is trivial. 11. The aortic valve is tricuspid. Aortic valve regurgitation is not visualized. Mild aortic valve sclerosis without stenosis. 12. The pulmonic valve was normal in structure. Pulmonic valve regurgitation is not visualized. 13. The inferior vena cava  is normal in size with greater than 50% respiratory variability, suggesting right atrial pressure of 3 mmHg. 14. Normal LV systolic function; grade 1 diastolic dysfunction; mild LVH.  History of Present Illness     Martha Sherman is a 64 y.o. female with hx of HTN, HL, DMII, strong family history of CAD who admitted with chest pain.  The patient reported that she has had intermittent episodes of right sided, dull chest pain underneath her right breast for quite some time. However, in the past three days, she has had 2-3 episodes per day. Also now with intermittent bilateral jaw pain, that sometimes occurs at the same time as the chest pain. Episodes generally occur while she is lying down and not associated with exertion. Reports chest discomfort will last for approximately 5 minutes, during which time she tries to be calm and relax and has been taking aspirin. Not associated with shortness of breath, nausea, diaphoresis. She reports no routine exercise but does help clean rental units between tenants and does not have chest pain with this exertion.  Given these symptoms, as well a strong family history - mother with MI and multiple family members with CAD - the patient presented to the ED where ECG showed slight inferior ST depression and TWI which were new from prior ECG. Laboratory results with normal creatinine and troponin levels 3 --> 2. D-dimer 0.31 and LE u/s negative for DVT. Reportedly the patient had a stress test in 2018 which was unremarkable. Given her ECG abnormalities she was subsequently transferred for further management.   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   Hospital Course  Consultants: None  Her symptoms were somewhat atypical however admitted for rule out given risk factors.  Her troponins remain negative.  LDL 72.  Hemoglobin A1c 7.1.  Covid negative.  Coronary CT without evidence of CAD, mild pulmonary artery pressure and LVH.   Echocardiogram showed LV function of 60 to 65%, mild LVH, grade 1 diastolic dysfunction and no wall motion abnormality.  No recurrent chest pain.  She felt stable for discharge.   Her blood pressure was elevated on arrival.  Advised to keep log and follow-up as outpatient with Dr. Bronson Sherman.  No change in therapy.  Her symptoms could be due to uncontrolled blood pressure.  Did the patient have an acute coronary syndrome (MI, NSTEMI, STEMI, etc) this admission?:  No                               Did the patient have a percutaneous coronary intervention (stent / angioplasty)?:  No.   _____________  Discharge Vitals Blood pressure (!) 159/81, pulse (!) 54, temperature 98.2 F (36.8 C), temperature source Oral, resp. rate 20, height 5' 7.5" (1.715 m), weight 83.3 kg, SpO2 100 %.  Filed Weights   08/14/19 1018 08/14/19 2007 08/15/19 0548  Weight: 83 kg 83.3 kg 83.3 kg    Labs & Radiologic Studies    CBC Recent Labs    08/14/19 2225 08/15/19 0346  WBC 5.7 5.3  HGB 12.7 12.3  HCT 40.7 38.7  MCV 82.6 82.0  PLT 292 102   Basic Metabolic Panel Recent Labs    08/14/19 1030 08/14/19 1030 08/14/19 2225 08/15/19 0346  NA 139  --   --  141  K 3.4*  --   --  3.7  CL 105  --   --  105  CO2 27  --   --  26  GLUCOSE 141*  --   --  108*  BUN 14  --   --  9  CREATININE 0.72   < > 0.65 0.77  CALCIUM 9.4  --   --  9.2   < > = values in this interval not displayed.   Liver Function Tests Recent Labs    08/14/19 1030  AST 22  ALT 25  ALKPHOS 78  BILITOT 0.7  PROT 8.0  ALBUMIN 4.2   High Sensitivity Troponin:   Recent Labs  Lab 08/14/19 1030 08/14/19 1228  TROPONINIHS 3 2    BNP Invalid input(s): POCBNP D-Dimer Recent Labs    08/14/19 1030  DDIMER 0.31   Hemoglobin A1C Recent Labs    08/14/19 2225  HGBA1C 7.1*   Fasting Lipid Panel Recent Labs    08/15/19 0346  CHOL 157  HDL 74  LDLCALC 72  TRIG 57  CHOLHDL 2.1  _____________  US Venous Img Lower  Left  (DVT Study)  Result Date: 08/14/2019 CLINICAL DATA:  Left leg pain and swelling EXAM: LEFT LOWER EXTREMITY VENOUS DOPPLER ULTRASOUND TECHNIQUE: Gray-scale sonography with graded compression, as well as color Doppler and duplex ultrasound were performed to evaluate the lower extremity deep venous systems from the level of the common femoral vein and including the common femoral, femoral, profunda femoral, popliteal and calf veins including the posterior tibial, peroneal and gastrocnemius veins when visible. The superficial great saphenous vein was also interrogated. Spectral Doppler was utilized to evaluate flow at rest and with distal augmentation maneuvers in the common femoral, femoral and popliteal veins. COMPARISON:  None.  FINDINGS: Contralateral Common Femoral Vein: Respiratory phasicity is normal and symmetric with the symptomatic side. No evidence of thrombus. Normal compressibility. Common Femoral Vein: No evidence of thrombus. Normal compressibility, respiratory phasicity and response to augmentation. Saphenofemoral Junction: No evidence of thrombus. Normal compressibility and flow on color Doppler imaging. Profunda Femoral Vein: No evidence of thrombus. Normal compressibility and flow on color Doppler imaging. Femoral Vein: No evidence of thrombus. Normal compressibility, respiratory phasicity and response to augmentation. Popliteal Vein: No evidence of thrombus. Normal compressibility, respiratory phasicity and response to augmentation. Calf Veins: No evidence of thrombus. Normal compressibility and flow on color Doppler imaging. Superficial Great Saphenous Vein: No evidence of thrombus. Normal compressibility. Venous Reflux:  None. Other Findings:  None. IMPRESSION: No evidence of deep venous thrombosis. Electronically Signed   By: Alcide Clever M.D.   On: 08/14/2019 11:18   CT CORONARY MORPH W/CTA COR W/SCORE W/CA W/CM &/OR WO/CM  Result Date: 08/15/2019 HISTORY: Chest pain, acute, high prob, ACS  suspected EXAM: Cardiac/Coronary  CT TECHNIQUE: The patient was scanned on a [employer]. PROTOCOL: A 120 kV prospective scan was triggered in the descending thoracic aorta at 111 HU's. Axial non-contrast 3 mm slices were carried out through the heart. The data set was analyzed on a dedicated work station and scored using the Agatson method. Gantry rotation speed was 250 msecs and collimation was .6 mm. Beta blockade and 0.8 mg of sl NTG was given. The 3D data set was reconstructed in 5% intervals of the 67-82 % of the R-R cycle. Diastolic phases were analyzed on a dedicated work station using MPR, MIP and VRT modes. The patient received 75mL OMNIPAQUE IOHEXOL 350 MG/ML SOLN of contrast. FINDINGS: Coronary calcium score: The patient's coronary artery calcium score is 0, which places the patient in the 0 percentile. Coronary arteries: Normal coronary origins.  Right dominance. Right Coronary Artery: No detectable plaque or stenosis. Left Main Coronary Artery: No detectable plaque or stenosis. Left Anterior Descending Coronary Artery: No detectable plaque or stenosis. Left Circumflex Artery: No detectable plaque or stenosis. Aorta: Normal size, 32 x 32 x 33 mm at sinuses of Valsalva, 34 mm at the mid ascending aorta (level of the PA bifurcation) measured double oblique. No calcifications. No dissection. Aortic Valve: No calcifications.  Trileaflet aortic valve. Other findings: Normal pulmonary vein drainage into the left atrium. Normal left atrial appendage without a thrombus. Mildly dilated main pulmonary artery, 32 mm. Mild concentric left ventricular hypertrophy, 12 mm. Grossly preserved left ventricular systolic function. IMPRESSION: 1. No evidence of CAD, CADRADS = 0. 2. Coronary calcium score of 0. This was 0 percentile for age and sex matched control. 3. Normal coronary origin with right dominance. 4. Mildly dilated main pulmonary artery, 32 mm may suggest increased pulmonary artery pressure. 5. Mild  concentric left ventricular hypertrophy, 12 mm wall thickness. Electronically Signed   By: Weston Brass   On: 08/15/2019 13:53   DG Chest Portable 1 View  Result Date: 08/14/2019 CLINICAL DATA:  Right-sided chest pain with dizziness for 3 days EXAM: PORTABLE CHEST 1 VIEW COMPARISON:  None. FINDINGS: The heart size and mediastinal contours are within normal limits. Both lungs are clear. The visualized skeletal structures are unremarkable. IMPRESSION: No active disease. Electronically Signed   By: Elige Ko   On: 08/14/2019 10:45   ECHOCARDIOGRAM COMPLETE  Result Date: 08/15/2019   ECHOCARDIOGRAM REPORT   Patient Name:   Martha Sherman Date of Exam: 08/15/2019 Medical Rec #:  [id number]  Height:       67.5 in Accession #:    [id number]      Weight:       183.7 lb Date of Birth:  02/26/1956        BSA:          1.96 m Patient Age:    63 years        BP:           159/81 mmHg Patient Gender: F               HR:           54 bpm. Exam Location:  Inpatient Procedure: 2D Echo, Cardiac Doppler and Color Doppler Indications:    Chest pain  History:        Patient has no prior history of Echocardiogram examinations.                 Risk Factors:Hypertension, Diabetes and Dyslipidemia.  Sonographer:    Lavenia AtlasBrooke Strickland Referring Phys: [phone] SURESH A KONESWARAN IMPRESSIONS  1. Left ventricular ejection fraction, by visual estimation, is 60 to 65%. The left ventricle has normal function. There is no left ventricular hypertrophy.  2. Left ventricular diastolic parameters are consistent with Grade I diastolic dysfunction (impaired relaxation).  3. The left ventricle has no regional wall motion abnormalities.  4. Global right ventricle has normal systolic function.The right ventricular size is normal.  5. Left atrial size was normal.  6. Right atrial size was normal.  7. Trivial pericardial effusion is present.  8. Mild mitral annular calcification.  9. The mitral valve is normal in structure. Trivial mitral valve  regurgitation. No evidence of mitral stenosis. 10. The tricuspid valve is normal in structure. Tricuspid valve regurgitation is trivial. 11. The aortic valve is tricuspid. Aortic valve regurgitation is not visualized. Mild aortic valve sclerosis without stenosis. 12. The pulmonic valve was normal in structure. Pulmonic valve regurgitation is not visualized. 13. The inferior vena cava is normal in size with greater than 50% respiratory variability, suggesting right atrial pressure of 3 mmHg. 14. Normal LV systolic function; grade 1 diastolic dysfunction; mild LVH. FINDINGS  Left Ventricle: Left ventricular ejection fraction, by visual estimation, is 60 to 65%. The left ventricle has normal function. The left ventricle has no regional wall motion abnormalities. There is no left ventricular hypertrophy. Left ventricular diastolic parameters are consistent with Grade I diastolic dysfunction (impaired relaxation). Right Ventricle: The right ventricular size is normal. Global RV systolic function is has normal systolic function. The tricuspid regurgitant velocity is 1.81 m/s, and with an assumed right atrial pressure of 3 mmHg, the estimated right ventricular systolic pressure is normal at 16.1 mmHg. Left Atrium: Left atrial size was normal in size. Right Atrium: Right atrial size was normal in size Pericardium: Trivial pericardial effusion is present. Mitral Valve: The mitral valve is normal in structure. Mild mitral annular calcification. Trivial mitral valve regurgitation. No evidence of mitral valve stenosis by observation. Tricuspid Valve: The tricuspid valve is normal in structure. Tricuspid valve regurgitation is trivial. Aortic Valve: The aortic valve is tricuspid. Aortic valve regurgitation is not visualized. Mild aortic valve sclerosis is present, with no evidence of aortic valve stenosis. Pulmonic Valve: The pulmonic valve was normal in structure. Pulmonic valve regurgitation is not visualized. Pulmonic  regurgitation is not visualized. Aorta: The aortic root is normal in size and structure. Venous: The inferior vena cava is normal in size with greater than 50% respiratory variability, suggesting right  atrial pressure of 3 mmHg. IAS/Shunts: No atrial level shunt detected by color flow Doppler. Additional Comments: Normal LV systolic function; grade 1 diastolic dysfunction; mild LVH.  LEFT VENTRICLE PLAX 2D LVIDd:         4.80 cm  Diastology LVIDs:         3.10 cm  LV e' lateral:   6.31 cm/s LV PW:         1.00 cm  LV E/e' lateral: 6.2 LV IVS:        1.10 cm  LV e' medial:    5.11 cm/s LVOT diam:     1.80 cm  LV E/e' medial:  7.6 LV SV:         70 ml LV SV Index:   34.59 LVOT Area:     2.54 cm  RIGHT VENTRICLE RV Basal diam:  2.30 cm RV S prime:     8.59 cm/s TAPSE (M-mode): 3.9 cm LEFT ATRIUM             Index       RIGHT ATRIUM           Index LA diam:        3.60 cm 1.84 cm/m  RA Area:     18.10 cm LA Vol (A2C):   44.5 ml 22.69 ml/m RA Volume:   55.10 ml  28.09 ml/m LA Vol (A4C):   39.2 ml 19.99 ml/m LA Biplane Vol: 42.7 ml 21.77 ml/m  AORTIC VALVE LVOT Vmax:   93.30 cm/s LVOT Vmean:  60.100 cm/s LVOT VTI:    0.188 m  AORTA Ao Root diam: 2.90 cm MITRAL VALVE                        TRICUSPID VALVE MV Area (PHT): 2.91 cm             TR Peak grad:   13.1 mmHg MV PHT:        75.69 msec           TR Vmax:        181.00 cm/s MV Decel Time: 261 msec MV E velocity: 39.00 cm/s 103 cm/s  SHUNTS MV A velocity: 49.70 cm/s 70.3 cm/s Systemic VTI:  0.19 m MV E/A ratio:  0.78       1.5       Systemic Diam: 1.80 cm  Olga MillersBrian Crenshaw MD Electronically signed by Olga MillersBrian Crenshaw MD Signature Date/Time: 08/15/2019/2:00:51 PM    Final    Disposition   Pt is being discharged home today in good condition.  Follow-up Plans & Appointments    Follow-up Information    Laqueta LindenKoneswaran, Suresh A, MD Follow up.   Specialty: Cardiology Why: Office will call with date and time of appointment  Contact information: [address] [place] [id number] [phone]          Discharge Instructions    Diet - low sodium heart healthy   Complete by: As directed    Discharge instructions   Complete by: As directed    Keep log of blood pressure and bring during follow-up   Increase activity slowly   Complete by: As directed       Discharge Medications   Allergies as of 08/15/2019   No Known Allergies     Medication List    TAKE these medications   albuterol 108 (90 Base) MCG/ACT inhaler Commonly known as: VENTOLIN HFA Inhale 2 puffs into the lungs  4 (four) times daily as needed for wheezing.   amLODipine 10 MG tablet Commonly known as: NORVASC Take 10 mg by mouth daily.   aspirin EC 81 MG tablet Take 81 mg by mouth.   diclofenac 75 MG EC tablet Commonly known as: VOLTAREN Take 1 tablet (75 mg total) by mouth 2 (two) times daily.   diclofenac 75 MG EC tablet Commonly known as: VOLTAREN Take 1 tablet (75 mg total) by mouth 2 (two) times daily.   diclofenac 75 MG EC tablet Commonly known as: VOLTAREN Take 1 tablet (75 mg total) by mouth 2 (two) times daily.   gabapentin 300 MG capsule Commonly known as: NEURONTIN Take 300 mg by mouth as needed (pain).   losartan-hydrochlorothiazide 100-25 MG tablet Commonly known as: HYZAAR Take 1 tablet by mouth daily.   metFORMIN 500 MG 24 hr tablet Commonly known as: GLUCOPHAGE-XR Take 500 mg by mouth daily.   omeprazole 20 MG capsule Commonly known as: PRILOSEC Take 20 mg by mouth daily as needed (acid reflux).   OneTouch Verio test strip Generic drug: glucose blood   pregabalin 75 MG capsule Commonly known as: Lyrica Take 1 capsule (75 mg total) by mouth 2 (two) times daily.   rosuvastatin 5 MG tablet Commonly known as: CRESTOR   tiZANidine 2 MG tablet Commonly known as: Zanaflex Take 1 tablet (2 mg total) by mouth every 6 (six) hours as needed for muscle spasms.   Trulicity 0.75 MG/0.5ML Sopn Generic drug: Dulaglutide Inject 0.75  mg into the skin once a week. Tuesdays   TYLENOL 500 MG tablet Generic drug: acetaminophen Take 500 mg by mouth as needed. For pain   Vitamin D (Ergocalciferol) 1.25 MG (50000 UNIT) Caps capsule Commonly known as: DRISDOL Take 50,000 Units by mouth every 7 (seven) days.          Outstanding Labs/Studies   None  Duration of Discharge Encounter   Greater than 30 minutes including physician time.  Lorelei Pont, PA 08/15/2019, 4:16 PM

## 2019-08-15 NOTE — Progress Notes (Signed)
  Echocardiogram 2D Echocardiogram has been performed.  Pieter Partridge 08/15/2019, 1:48 PM

## 2019-08-15 NOTE — Progress Notes (Addendum)
Progress Note  Patient Name: Martha Sherman Date of Encounter: 08/15/2019  Primary Cardiologist: No primary care provider on file.   Subjective   She was previously experiencing right sided inframammary pain.  Now she has some burning sensations in the center of her chest which she says feels like heartburn.  She also complains of dizziness when standing up and walking around.  She denies palpitations or shortness of breath.  It appears she underwent an echocardiogram and a stress test either 1 or 2 years ago with a cardiologist in [facility].  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 She is married with one adult child and one 10-yr old granddaughter, Research officer, trade union.  Inpatient Medications    Scheduled Meds: . amLODipine  10 mg Oral Daily  . aspirin EC  81 mg Oral Daily  . heparin  5,000 Units Subcutaneous Q8H  . losartan  100 mg Oral Daily   And  . hydrochlorothiazide  25 mg Oral Daily  . insulin aspart  0-15 Units Subcutaneous TID WC  . pantoprazole  40 mg Oral Daily  . rosuvastatin  5 mg Oral Daily   Continuous Infusions:  PRN Meds: acetaminophen, nitroGLYCERIN, ondansetron (ZOFRAN) IV   Vital Signs    Vitals:   08/14/19 2007 08/15/19 0536 08/15/19 0548 08/15/19 0908  BP: (!) 178/87 124/71  (!) 146/72  Pulse: 86 78    Resp: 20     Temp: 98.9 F (37.2 C) 98.2 F (36.8 C)    TempSrc: Oral Oral    SpO2: 98% 98%    Weight: 83.3 kg  83.3 kg   Height: 5' 7.5" (1.715 m)      No intake or output data in the 24 hours ending 08/15/19 0940 Filed Weights   08/14/19 1018 08/14/19 2007 08/15/19 0548  Weight: 83 kg 83.3 kg 83.3 kg    Telemetry    Sinus rhythm - Personally Reviewed  ECG    Sinus rhythm - Personally Reviewed  Physical Exam   GEN: No acute distress.   Neck: No JVD Cardiac: RRR, no murmurs, rubs, or gallops.  Respiratory: Clear to auscultation bilaterally. GI: Soft, nontender, non-distended  MS: No edema; No  deformity. Neuro:  Nonfocal  Psych: Normal affect   Labs    Chemistry Recent Labs  Lab 08/14/19 1030 08/14/19 2225 08/15/19 0346  NA 139  --  141  K 3.4*  --  3.7  CL 105  --  105  CO2 27  --  26  GLUCOSE 141*  --  108*  BUN 14  --  9  CREATININE 0.72 0.65 0.77  CALCIUM 9.4  --  9.2  PROT 8.0  --   --   ALBUMIN 4.2  --   --   AST 22  --   --   ALT 25  --   --   ALKPHOS 78  --   --   BILITOT 0.7  --   --   GFRNONAA >60 >60 >60  GFRAA >60 >60 >60  ANIONGAP 7  --  10     Hematology Recent Labs  Lab 08/14/19 1030 08/14/19 2225 08/15/19 0346  WBC 4.7 5.7 5.3  RBC 4.83 4.93 4.72  HGB 12.5 12.7 12.3  HCT 40.1 40.7 38.7  MCV 83.0 82.6 82.0  MCH 25.9* 25.8* 26.1  MCHC 31.2 31.2 31.8  RDW 15.1 15.1 15.1  PLT 294 292 289    Cardiac EnzymesNo results for input(s): TROPONINI in  the last 168 hours. No results for input(s): TROPIPOC in the last 168 hours.   BNPNo results for input(s): BNP, PROBNP in the last 168 hours.   DDimer  Recent Labs  Lab 08/14/19 1030  DDIMER 0.31     Radiology    US Venous Img Lower  Left (DVT Study)  Result Date: 08/14/2019 CLINICAL DATA:  Left leg pain and swelling EXAM: LEFT LOWER EXTREMITY VENOUS DOPPLER ULTRASOUND TECHNIQUE: Gray-scale sonography with graded compression, as well as color Doppler and duplex ultrasound were performed to evaluate the lower extremity deep venous systems from the level of the common femoral vein and including the common femoral, femoral, profunda femoral, popliteal and calf veins including the posterior tibial, peroneal and gastrocnemius veins when visible. The superficial great saphenous vein was also interrogated. Spectral Doppler was utilized to evaluate flow at rest and with distal augmentation maneuvers in the common femoral, femoral and popliteal veins. COMPARISON:  None. FINDINGS: Contralateral Common Femoral Vein: Respiratory phasicity is normal and symmetric with the symptomatic side. No evidence of  thrombus. Normal compressibility. Common Femoral Vein: No evidence of thrombus. Normal compressibility, respiratory phasicity and response to augmentation. Saphenofemoral Junction: No evidence of thrombus. Normal compressibility and flow on color Doppler imaging. Profunda Femoral Vein: No evidence of thrombus. Normal compressibility and flow on color Doppler imaging. Femoral Vein: No evidence of thrombus. Normal compressibility, respiratory phasicity and response to augmentation. Popliteal Vein: No evidence of thrombus. Normal compressibility, respiratory phasicity and response to augmentation. Calf Veins: No evidence of thrombus. Normal compressibility and flow on color Doppler imaging. Superficial Great Saphenous Vein: No evidence of thrombus. Normal compressibility. Venous Reflux:  None. Other Findings:  None. IMPRESSION: No evidence of deep venous thrombosis. Electronically Signed   By: Alcide Clever M.D.   On: 08/14/2019 11:18   DG Chest Portable 1 View  Result Date: 08/14/2019 CLINICAL DATA:  Right-sided chest pain with dizziness for 3 days EXAM: PORTABLE CHEST 1 VIEW COMPARISON:  None. FINDINGS: The heart size and mediastinal contours are within normal limits. Both lungs are clear. The visualized skeletal structures are unremarkable. IMPRESSION: No active disease. Electronically Signed   By: Elige Ko   On: 08/14/2019 10:45    Cardiac Studies   NA  Patient Profile     64 y.o. female with HTN, HL, DMII, strong family history of CAD who presents with chest pain.  Assessment & Plan    1.  Chest pain: While symptoms are somewhat atypical, she has had intermittent retrosternal chest pains and jaw pains over the last 3 days.  Symptoms primarily occur at rest.  She has several cardiovascular risk factors including hypertension, hyperlipidemia, type 2 diabetes mellitus, and a strong family history of premature coronary disease on her mother side.  High-sensitivity troponins have been normal.   Continue aspirin and statin.  I will see if we can obtain a coronary CT angiogram today. I will order a 2-D echocardiogram with Doppler to evaluate cardiac structure, function, and regional wall motion.  2.  Hypertension: Blood pressure was mildly elevated at the time my evaluation.  Continue to monitor.  Continue losartan, hydrochlorothiazide, and amlodipine.  3.  Type 2 diabetes mellitus: A1c 7.1%.  Continue sliding scale insulin.  She is also on statin therapy.  4.  Hyperlipidemia: Lipids are well controlled.  Continue rosuvastatin.    For questions or updates, please contact [facility] Please consult [email or web address] for contact info under Cardiology/STEMI.      Signed, Prentice Docker, MD  08/15/2019, 9:40 AM

## 2019-08-15 NOTE — Progress Notes (Signed)
Patient complained that her lips were swollen and felt num an hour after taking Lasartan and Hydrochlorothiazide. She wanted to know if she was allergic to the medication. Pt assessed, lips look normal. The writer explained to her that the medications she took were the same meds.she take at home. Will continue to monitor pt.

## 2019-08-16 DIAGNOSIS — R9431 Abnormal electrocardiogram [ECG] [EKG]: Secondary | ICD-10-CM

## 2019-08-17 ENCOUNTER — Telehealth: Payer: Self-pay | Admitting: Physician Assistant

## 2019-08-17 NOTE — Telephone Encounter (Signed)
I spoke to [facility] from [facility] who informed me that Dr Amil Amen has completed his interpretation of the CT, ordered by Tereso Newcomer, PA.

## 2019-08-17 NOTE — Telephone Encounter (Signed)
[name], with [facility], is calling to provide Martha Sherman with an over read interpretation for a CT Cardiac that patient had done on 08/15/19. Please call to discuss.

## 2019-08-18 ENCOUNTER — Telehealth: Payer: Self-pay

## 2019-08-18 DIAGNOSIS — R590 Localized enlarged lymph nodes: Secondary | ICD-10-CM

## 2019-08-18 NOTE — Telephone Encounter (Signed)
-----   Message from Laqueta Linden, MD sent at 08/18/2019 12:25 PM EST ----- Tereso Newcomer, PA-C alerted me regarding these results today.  There is bilateral hilar lymphadenopathy.  I called the patient and spoke with her and explained the results to her.  Please make a pulmonary referral to our team in [place].  Please forward a copy of coronary CT results to her PCP.  Please call her and confirm her appointment with me on February 4 at 1:30 PM.

## 2019-08-18 NOTE — Telephone Encounter (Signed)
I spoke with patient.I gave her the radiology results of lungs done  her cardiac CT. I referred her to [facility] in [place]

## 2019-08-27 ENCOUNTER — Ambulatory Visit: Payer: Medicare Other | Admitting: Cardiovascular Disease

## 2019-08-27 ENCOUNTER — Institutional Professional Consult (permissible substitution): Payer: Medicare Other | Admitting: Critical Care Medicine

## 2019-09-10 ENCOUNTER — Ambulatory Visit: Payer: Medicare Other | Admitting: Cardiovascular Disease

## 2019-11-02 ENCOUNTER — Ambulatory Visit: Payer: Medicare Other | Admitting: Cardiovascular Disease

## 2019-11-23 ENCOUNTER — Ambulatory Visit: Payer: Medicare Other | Admitting: Cardiovascular Disease

## 2020-04-11 ENCOUNTER — Institutional Professional Consult (permissible substitution): Payer: Medicare Other | Admitting: Pulmonary Disease

## 2020-04-11 ENCOUNTER — Other Ambulatory Visit: Payer: Self-pay | Admitting: Neurosurgery

## 2020-04-26 NOTE — H&P (Signed)
Patient ID:   [id number] Patient: Martha Sherman  Date of Birth: 01/09/1956 Visit Type: Office Visit   Date: 04/11/2020 10:15 AM Provider: Danae Orleans. Venetia Maxon MD   This 64 year old female presents for neck pain and bilateral arm pain.  HISTORY OF PRESENT ILLNESS: 1.  neck pain and bilateral arm pain  Last seen in February, patient returns today after experiencing pain exacerbation both shoulders and both upper extremities.  Pain initially began in the right shoulder and she sought advice from her primary care physician fearing cardiac issues given her family history.  More recently, she has begun experiencing significant left arm pain as well.  Primary physician began ibuprofen 600 mg, which is taken 2-3 times daily.  Cervical MRI on canopy  The patient's cervical MRI shows slight progression of the Chiari malformation, which was 12 mm in 2018 and is now 16 mm on the current MRI.  She is not having significant symptoms related to her Chiari.  She does not note significant headache.  The cervical MRI shows worsening of cervical disc herniations.  She has severe bilateral foraminal stenosis at C4-5, a small right-sided disc protrusion at C5-6 which is causing some foraminal narrowing and severe right greater than left foraminal narrowing at C6-7 with a disc herniation to the right.  The patient is having severe bilateral upper extremity pain and weakness.  This is right greater than left.  She notes pain into both of her shoulders.  Her examination demonstrates right triceps strength at 4/5 and she has mild bilateral deltoid weakness.  She has positive Spurling maneuver to either side.  Based on the persistence and severity of the patient's cervical radiculopathy, and the fact that we have been managing this conservatively for multiple years and is now a significantly worse along with weakness, I have recommended proceeding with surgery.  I will recommend decompression with fusion C4-5, C5-6,  C6-7 levels.  I will not address the Chiari malformation which I do not believe is currently symptomatic.      Medical/Surgical/Interim History Reviewed, no change.     PAST MEDICAL HISTORY, SURGICAL HISTORY, FAMILY HISTORY, SOCIAL HISTORY AND REVIEW OF SYSTEMS I have reviewed the patient's past medical, surgical, family and social history as well as the comprehensive review of systems as included on the [facility] history form dated 09/14/2019, which I have signed.  Family History: Reviewed, no changes.    Social History: Reviewed, no changes.   MEDICATIONS: (added, continued or stopped this visit) Started Medication Directions Instruction Stopped  amlodipine 10 mg tablet take 1 tablet by oral route  every day   06/25/2018 hydrocodone 5 mg-acetaminophen 325 mg tablet take 1 tablet by oral route  every 6 hours as needed for pain    losartan 100 mg-hydrochlorothiazide 25 mg tablet take 1 tablet by oral route  every day   06/25/2018 Medrol (Pak) 4 mg tablets in a dose pack Take as directed    metformin 500 mg tablet take 1 tablet by oral route 2 times every day with morning and evening meals   06/25/2018 methocarbamol 500 mg tablet take 1 tablet by oral route 4 times every day as needed    omeprazole 20 mg tablet,delayed release 1 tablet by mouth daily.   06/25/2018 Phenergan 25mg  ORAL Take 1/2 to one every 6-8 hours as needed    Trulicity 0.75 mg/0.5 mL subcutaneous pen injector inject 0.5 milliliter by subcutaneous route  every week in the abdomen, thigh, or upper  arm rotating injection sites      ALLERGIES: Ingredient Reaction Medication Name Comment NO KNOWN ALLERGIES    No known allergies. Reviewed, no changes.    PHYSICAL EXAM:  Vitals Date Temp F BP Pulse Ht In Wt Lb BMI BSA Pain Score 04/11/2020  166/80 69 67 192.6 30.17  3/10     IMPRESSION:  Cervical disc  herniations and cervical radiculopathy C4-5, C5-6, C6-7 levels.  There is incidental slightly progressed Chiari malformation with cerebellar tonsils descended 16 mm in the spinal canal.  She is currently not symptomatic from this and I have not recommended addressing this problem.  We will go ahead with cervical spinal surgery and then continue to monitor her for Chiari symptoms.  PLAN: Anterior cervical decompression and fusion C4-5, C5-6, C6-7 levels.  Risks and benefits were discussed in detail with the patient and she wishes to proceed with surgery.  Orders: Diagnostic Procedures: Assessment Procedure M54.12 Cervical Spine- Lateral Instruction(s)/Education: Assessment Instruction I10 Lifestyle education Z68.30 Dietary management education, guidance, and counseling Miscellaneous: Assessment  M50.20 Hard Cervical Collar  Completed Orders (this encounter) Order Details Reason Side Interpretation Result Initial Treatment Date Region Dietary management education, guidance, and counseling Encouraged patient to eat well balanced diet.       Lifestyle education Patient will follow up with Primary Care Physician.        Assessment/Plan  # Detail Type Description  1. 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     2. Assessment Radiculopathy, cervical region (M54.12).     3. Assessment Cervical disc disorder at C6-C7 level with radiculopathy (M50.123).     4. Assessment Herniated nucleus pulposus, cervical (M50.20).  Plan Orders Hard Cervical Collar.     5. Assessment Right arm weakness (R29.898).     6. Assessment Essential (primary) hypertension (I10).     7. Assessment Body mass index (BMI) 30.0-30.9, adult (Z68.30).  Plan Orders Today's instructions / counseling include(s) Dietary management education, guidance, and counseling. Clinical information/comments: Encouraged patient to eat well balanced diet.       Pain  Management Plan Pain Scale: 3/10. Method: Numeric Pain Intensity Scale. Location: neck. Onset: 05/27/2015. Duration: varies. Quality: discomforting. Pain management follow-up plan of care: Patient will continue medication management..              Provider:  Danae Orleans. Venetia Maxon MD  04/11/2020 12:01 PM    Dictation edited by: Danae Orleans. Venetia Maxon    CC Providers: Caffie Damme [address] [place],  [place]  [id number]   Maeola Harman MD  [address] [place], [place] [address]               Electronically signed by Danae Orleans. Venetia Maxon MD on 04/11/2020 05:42 PM

## 2020-04-29 ENCOUNTER — Institutional Professional Consult (permissible substitution): Payer: Medicare Other | Admitting: Pulmonary Disease

## 2020-05-04 ENCOUNTER — Other Ambulatory Visit (HOSPITAL_COMMUNITY): Payer: Medicare Other

## 2020-05-04 ENCOUNTER — Inpatient Hospital Stay (HOSPITAL_COMMUNITY): Admission: RE | Admit: 2020-05-04 | Payer: Medicare Other | Source: Ambulatory Visit

## 2020-05-06 ENCOUNTER — Ambulatory Visit: Admit: 2020-05-06 | Payer: Medicare Other | Admitting: Neurosurgery

## 2020-05-06 SURGERY — ANTERIOR CERVICAL DECOMPRESSION/DISCECTOMY FUSION 3 LEVELS
Anesthesia: General

## 2020-07-21 ENCOUNTER — Other Ambulatory Visit: Payer: Self-pay | Admitting: Neurosurgery

## 2020-07-21 DIAGNOSIS — H532 Diplopia: Secondary | ICD-10-CM

## 2020-07-26 ENCOUNTER — Other Ambulatory Visit: Payer: Medicare Other

## 2020-08-01 ENCOUNTER — Telehealth: Payer: Self-pay

## 2020-08-01 NOTE — Telephone Encounter (Signed)
Approved from my end thanks

## 2020-08-01 NOTE — Telephone Encounter (Signed)
We received a referral for this patient from Dr Venetia Maxon for diplopia. She is a patient of Dr. Celene Squibb, last seen 2016. She is requesting a provider switch to Dr. Pearlean Brownie.  Please advise

## 2020-08-01 NOTE — Telephone Encounter (Signed)
Ok with me as well  

## 2020-08-04 ENCOUNTER — Ambulatory Visit: Payer: Medicare Other | Admitting: Neurology

## 2020-08-14 ENCOUNTER — Other Ambulatory Visit: Payer: Medicare Other

## 2020-08-15 ENCOUNTER — Encounter: Payer: Self-pay | Admitting: Neurology

## 2020-08-15 ENCOUNTER — Ambulatory Visit (INDEPENDENT_AMBULATORY_CARE_PROVIDER_SITE_OTHER): Payer: Medicare Other | Admitting: Neurology

## 2020-08-15 ENCOUNTER — Other Ambulatory Visit: Payer: Self-pay

## 2020-08-15 VITALS — BP 161/82 | HR 82 | Ht 67.5 in | Wt 196.6 lb

## 2020-08-15 DIAGNOSIS — I161 Hypertensive emergency: Secondary | ICD-10-CM | POA: Diagnosis not present

## 2020-08-15 DIAGNOSIS — H538 Other visual disturbances: Secondary | ICD-10-CM | POA: Diagnosis not present

## 2020-08-15 NOTE — Progress Notes (Signed)
[facility] [address] [place]. [place] [address] [phone]       OFFICE CONSULT NOTE  Ms. Carloyn Jaeger Date of Birth:  May 19, 1956 Medical Record Number:  [id number]   Referring MD: Maeola Harman  Reason for Referral: Diplopia  HPI: Martha Sherman is a 65 year old pleasant African-American lady seen today for initial office consultation visit.  History is obtained from the patient and review of electronic medical records and imaging film results in PACS personally.  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  Patient states she was in a vacation home in [place] 3 weeks ago when she woke up with sudden onset of pain behind the right eye as well as blurred vision.  The hospital and [facility] had closed hence she and husband decided to drive back to [place] and went to [facility] ER on 07/18/2020 where a CT scan of the head was obtained which was unremarkable except for left skull base chronic pseudomeningocele which appears slightly larger than previous study from 2013.  Stable chronic cerebellar tonsillar ectopia was noted.  Patient subsequently had outpatient MRI scan and orbit done on 07/21/2020 which was negative for acute infarct and only showed mild age-related chronic small vessel disease.  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  MRI of the orbit was unremarkable.  Patient states she did see the ophthalmologist in [facility] but I do not have those results.  He apparently did not find anything wrong with her eyes but her vision symptoms gradually resolved in few days and now she is feels right eye vision is back to normal.  She denies any drooping of her eyelids or trouble moving her eyes or  lazy eye.  ER physician's notes do not mention any pupillary asymmetries. . Her blood pressure significantly elevated at 215/95 at [facility] and she feels her blood pressure is doing much better now and today it is 161/82.  She denies any prior history of migraine headaches, brain aneurysm, strokes, TIAs or significant neurological problems.  She does have degenerative spine disease and underwent lumbar spine surgery by Dr. Venetia Maxon in 2010 and is being conservatively followed for her Arnold-Chiari malformation.  Review of electronic records show that she had CT angiogram of the brain and neck done on 06/05/2018 which was unremarkable without evidence of any aneurysms or large vessel stenosis. ROS:   14 system review of systems is positive for eye pain, blurred vision, double vision, tingling and numbness in the feet all other systems negative  PMH:  Past Medical History:  Diagnosis Date  . Acid reflux   . Back pain   . Diabetes mellitus   . High cholesterol   . History of Chiari malformation   . Hypertension   . Irregular heart beats    occassional  . Murmur     Social History:  Social History   Socioeconomic History  . Marital status: Married    Spouse name: [name]  . Number of children: 1  . Years of education: 110  . Highest education level: Not on file  Occupational History  . Occupation: retired    Comment: sherriff's office, jail  Tobacco Use  . Smoking status: Never Smoker  . Smokeless tobacco: Never Used  Substance and Sexual  Activity  . Alcohol use: No  . Drug use: No  . Sexual activity: Yes    Birth control/protection: Surgical  Other Topics Concern  . Not on file  Social History Narrative   Lives at home with husband   Right Handed   Drinks 3-5 cups daily   Social Determinants of Health   Financial Resource Strain: Not on file  Food Insecurity: Not on file  Transportation Needs: Not on file  Physical Activity: Not on file  Stress: Not on file  Social  Connections: Not on file  Intimate Partner Violence: Not on file    Medications:   Current Outpatient Medications on File Prior to Visit  Medication Sig Dispense Refill  . acetaminophen (TYLENOL) 500 MG tablet Take 500 mg by mouth as needed. For pain    . albuterol (VENTOLIN HFA) 108 (90 Base) MCG/ACT inhaler Inhale 2 puffs into the lungs 4 (four) times daily as needed for wheezing.    Marland Kitchen amLODipine (NORVASC) 10 MG tablet Take 10 mg by mouth daily.    Marland Kitchen aspirin EC 81 MG tablet Take 81 mg by mouth.    . diclofenac (VOLTAREN) 75 MG EC tablet Take 1 tablet (75 mg total) by mouth 2 (two) times daily. 50 tablet 2  . Dulaglutide 0.75 MG/0.5ML SOPN Inject 0.75 mg into the skin once a week.     . gabapentin (NEURONTIN) 300 MG capsule Take 300 mg by mouth as needed (pain).    Marland Kitchen losartan-hydrochlorothiazide (HYZAAR) 100-25 MG per tablet Take 1 tablet by mouth daily.    . metFORMIN (GLUCOPHAGE-XR) 500 MG 24 hr tablet Take 1,000 mg by mouth in the morning and at bedtime.     . Multiple Vitamin (MULTIVITAMIN WITH MINERALS) TABS tablet Take 1 tablet by mouth daily.    Marland Kitchen omeprazole (PRILOSEC) 20 MG capsule Take 20 mg by mouth daily as needed (acid reflux).    Letta Pate VERIO test strip     . rosuvastatin (CRESTOR) 5 MG tablet Take 5 mg by mouth daily.     Marland Kitchen tiZANidine (ZANAFLEX) 2 MG tablet Take 1 tablet (2 mg total) by mouth every 6 (six) hours as needed for muscle spasms. 20 tablet 0  . Vitamin D, Ergocalciferol, (DRISDOL) 1.25 MG (50000 UT) CAPS capsule Take 50,000 Units by mouth every 7 (seven) days.     . diclofenac (VOLTAREN) 75 MG EC tablet Take 1 tablet (75 mg total) by mouth 2 (two) times daily. 50 tablet 2  . diclofenac (VOLTAREN) 75 MG EC tablet Take 1 tablet (75 mg total) by mouth 2 (two) times daily. 50 tablet 2  . pregabalin (LYRICA) 75 MG capsule Take 1 capsule (75 mg total) by mouth 2 (two) times daily. 60 capsule 11   No current facility-administered medications on file prior to visit.     Allergies:  No Known Allergies  Physical Exam General: well developed, well nourished pleasant middle-age African-American lady, seated, in no evident distress Head: head normocephalic and atraumatic.   Neck: supple with no carotid or supraclavicular bruits Cardiovascular: regular rate and rhythm, no murmurs Musculoskeletal: no deformity Skin:  no rash/petichiae Vascular:  Normal pulses all extremities  Neurologic Exam Mental Status: Awake and fully alert. Oriented to place and time. Recent and remote memory intact. Attention span, concentration and fund of knowledge appropriate. Mood and affect appropriate.  Cranial Nerves: Fundoscopic exam reveals sharp disc margins. Pupils equal, briskly reactive to light. Extraocular movements full without nystagmus. Visual fields full to confrontation  except left eye inferior nasal field defect from old injury. Hearing intact. Facial sensation intact. Face, tongue, palate moves normally and symmetrically.  Motor: Normal bulk and tone. Normal strength in all tested extremity muscles. Sensory.: intact to touch , pinprick sensation but slightly diminished position and vibratory sensation in both feet from ankle down.  Coordination: Rapid alternating movements normal in all extremities. Finger-to-nose and heel-to-shin performed accurately bilaterally. Gait and Station: Arises from chair without difficulty. Stance is normal. Gait demonstrates normal stride length and balance . Able to heel, toe and tandem walk without difficulty.  Reflexes: 1+ and symmetric. Toes downgoing.      ASSESSMENT: 65 year old African-American lady with transient episode of right eye pain, blurred and double vision in the setting of hypertensive urgency which has resolved.  Negative brain and orbital MRI studies.  Clinical presentation likely suggests small vitreous or retinal hemorrhage rather than neurovascular etiology     PLAN: I had a long discussion with the patient  with regards to her transient episode of right eye pain, blurred vision and double vision in the setting of hypertensive urgency which appears to have resolved and does not appear to be neurovascular etiology as MRI scan of the brain and orbits have been unremarkable.  I recommend follow-up with the retinal specialist to rule out any ocular cause of her symptoms.  Continue strict control of hypertension blood pressure goal below 130/90 and take aspirin 81 mg daily for stroke prevention given her strong family history and multiple stroke risk factors.  No routine scheduled follow-up appointment with me is necessary but she may call and make an appointment if needed in the future.  Greater than 50% time during this 45-minute consultation was it was spent on counseling and coordination of care about the right eye blurred and double vision and answering questions Delia Heady, MD Note: This document was prepared with digital dictation and possible smart phrase technology. Any transcriptional errors that result from this process are unintentional.

## 2020-08-15 NOTE — Patient Instructions (Signed)
I had a long discussion with the patient with regards to her transient episode of right eye pain, blurred vision and double vision in the setting of hypertensive urgency which appears to have resolved and does not appear to be neurovascular etiology as MRI scan of the brain and orbits have been unremarkable.  I recommend follow-up with the retinal specialist to rule out any ocular cause of her symptoms.  Continue strict control of hypertension blood pressure goal below 130/90 and take aspirin 81 mg daily for stroke prevention given her strong family history and multiple stroke risk factors.  No routine scheduled follow-up appointment with me is necessary but she may call and make an appointment if needed in the future.

## 2020-11-05 ENCOUNTER — Encounter (HOSPITAL_BASED_OUTPATIENT_CLINIC_OR_DEPARTMENT_OTHER): Payer: Self-pay | Admitting: Emergency Medicine

## 2020-11-05 ENCOUNTER — Emergency Department (HOSPITAL_BASED_OUTPATIENT_CLINIC_OR_DEPARTMENT_OTHER): Payer: Medicare Other

## 2020-11-05 ENCOUNTER — Emergency Department (HOSPITAL_BASED_OUTPATIENT_CLINIC_OR_DEPARTMENT_OTHER)
Admission: EM | Admit: 2020-11-05 | Discharge: 2020-11-05 | Disposition: A | Discharge status: Home / Self Care | Payer: Medicare Other | Attending: Emergency Medicine | Admitting: Emergency Medicine

## 2020-11-05 ENCOUNTER — Other Ambulatory Visit: Payer: Self-pay

## 2020-11-05 DIAGNOSIS — R059 Cough, unspecified: Secondary | ICD-10-CM | POA: Diagnosis present

## 2020-11-05 DIAGNOSIS — Z79899 Other long term (current) drug therapy: Secondary | ICD-10-CM | POA: Diagnosis not present

## 2020-11-05 DIAGNOSIS — E785 Hyperlipidemia, unspecified: Secondary | ICD-10-CM | POA: Insufficient documentation

## 2020-11-05 DIAGNOSIS — Z7984 Long term (current) use of oral hypoglycemic drugs: Secondary | ICD-10-CM | POA: Insufficient documentation

## 2020-11-05 DIAGNOSIS — Z87898 Personal history of other specified conditions: Secondary | ICD-10-CM | POA: Diagnosis not present

## 2020-11-05 DIAGNOSIS — J209 Acute bronchitis, unspecified: Secondary | ICD-10-CM | POA: Diagnosis not present

## 2020-11-05 DIAGNOSIS — Z7982 Long term (current) use of aspirin: Secondary | ICD-10-CM | POA: Diagnosis not present

## 2020-11-05 DIAGNOSIS — Z862 Personal history of diseases of the blood and blood-forming organs and certain disorders involving the immune mechanism: Secondary | ICD-10-CM

## 2020-11-05 DIAGNOSIS — I10 Essential (primary) hypertension: Secondary | ICD-10-CM | POA: Diagnosis not present

## 2020-11-05 DIAGNOSIS — E1169 Type 2 diabetes mellitus with other specified complication: Secondary | ICD-10-CM | POA: Insufficient documentation

## 2020-11-05 MED ORDER — PREDNISONE 10 MG PO TABS: 20.0000 mg | ORAL_TABLET | Freq: Every day | ORAL | 0 refills | Status: AC

## 2020-11-05 NOTE — ED Provider Notes (Signed)
[facility] EMERGENCY DEPARTMENT Provider Note   CSN: [id number] Arrival date & time: 11/05/20  1116    [id number] History Chief Complaint  Patient presents with  . Cough    Martha Sherman is a 65 y.o. female.  HPI      65 year old female with a history of diabetes, hyperlipidemia, hypertension, recent diagnosis of sarcoidosis who presents with concern for cough for 2 weeks.  Reports [facility], had strep testing and Covid testing which are both negative.  Described initially having sore throat, congestion and cough.  Felt like the sore throat was due to drainage and cough.  She then saw her primary care doctor, and had antibiotics prescribed, and was given a steroid injection.  Reports the symptoms have persisted, and is here with concern for continued cough.  Reports has not had a chest x-ray since her symptoms began.  The cough is worse at night when she lays down.  Denies leg swelling, orthopnea.  Reports that she will have some pain when she coughs in her chest at this point, as well as a pain in her back that she feels is secondary to amount of coughing.  She denies having any other chest pain.  She denies persistent shortness of breath, but reports that when she has episodes of coughing she gets a sensation of pinching in her throat and has temporary feeling of dyspnea associated with this coughing and throat irritation.   Past Medical History:  Diagnosis Date  . Acid reflux   . Back pain   . Diabetes mellitus   . High cholesterol   . History of Chiari malformation   . Hypertension   . Irregular heart beats    occassional  . Murmur     Patient Active Problem List   Diagnosis Date Noted  . EKG abnormalities   . Chest pain due to CAD (HCC) 08/15/2019  . Hyperlipidemia LDL goal <70   . Family history of premature CAD   . Chest pain 08/14/2019  . Dyslipidemia 08/14/2019  . Lumbar pain 04/23/2018  . Pain in right foot 01/08/2018   . H/O: hysterectomy 08/14/2017  . SUI (stress urinary incontinence, female) 08/14/2017  . Women's annual routine gynecological examination 08/14/2017  . Boil of buttock 07/26/2017  . Chiari malformation type I (HCC) 07/04/2015  . Essential hypertension 01/19/2015  . Right arm pain 01/19/2015  . DM (diabetes mellitus) (HCC) 01/19/2015  . Cough 01/18/2015  . Shortness of breath 01/18/2015  . Pain of right heel 02/19/2014  . Plantar fasciitis of right foot 02/19/2014  . Metatarsal deformity 02/19/2014    Past Surgical History:  Procedure Laterality Date  . ABDOMINAL HYSTERECTOMY  1993   partial  . BACK SURGERY  2009     OB History   No obstetric history on file.     Family History  Problem Relation Age of Onset  . Heart attack Mother   . Cancer Father   . Alzheimer's disease Father   . Hypertension Sister   . Hypertension Brother     Social History   Tobacco Use  . Smoking status: Never Smoker  . Smokeless tobacco: Never Used  Substance Use Topics  . Alcohol use: No  . Drug use: No    Home Medications Prior to Admission medications   Medication Sig Start Date End Date Taking? Authorizing Provider  predniSONE (DELTASONE) 10 MG tablet Take 2 tablets (20 mg total) by mouth daily for  4 days. 11/05/20 11/09/20 Yes Alvira Monday, MD  acetaminophen (TYLENOL) 500 MG tablet Take 500 mg by mouth as needed. For pain    [provider]  albuterol (VENTOLIN HFA) 108 (90 Base) MCG/ACT inhaler Inhale 2 puffs into the lungs 4 (four) times daily as needed for wheezing. 05/23/19   [provider]  amLODipine (NORVASC) 10 MG tablet Take 10 mg by mouth daily.    [provider]  aspirin EC 81 MG tablet Take 81 mg by mouth.    [provider]  diclofenac (VOLTAREN) 75 MG EC tablet Take 1 tablet (75 mg total) by mouth 2 (two) times daily. 05/23/18   Lenn Sink, DPM  diclofenac (VOLTAREN) 75 MG EC tablet Take 1 tablet (75 mg total) by mouth 2  (two) times daily. 09/26/18   Lenn Sink, DPM  diclofenac (VOLTAREN) 75 MG EC tablet Take 1 tablet (75 mg total) by mouth 2 (two) times daily. 12/10/18   Lenn Sink, DPM  Dulaglutide 0.75 MG/0.5ML SOPN Inject 0.75 mg into the skin once a week.     [provider]  gabapentin (NEURONTIN) 300 MG capsule Take 300 mg by mouth as needed (pain). 07/31/16   [provider]  losartan-hydrochlorothiazide (HYZAAR) 100-25 MG per tablet Take 1 tablet by mouth daily.    [provider]  metFORMIN (GLUCOPHAGE-XR) 500 MG 24 hr tablet Take 1,000 mg by mouth in the morning and at bedtime.  09/23/18   [provider]  Multiple Vitamin (MULTIVITAMIN WITH MINERALS) TABS tablet Take 1 tablet by mouth daily.    [provider]  omeprazole (PRILOSEC) 20 MG capsule Take 20 mg by mouth daily as needed (acid reflux).    [provider]  [facility] VERIO test strip  09/12/18   [provider]  pregabalin (LYRICA) 75 MG capsule Take 1 capsule (75 mg total) by mouth 2 (two) times daily. 07/05/15   Anson Fret, MD  rosuvastatin (CRESTOR) 5 MG tablet Take 5 mg by mouth daily.  09/12/18   [provider]  tiZANidine (ZANAFLEX) 2 MG tablet Take 1 tablet (2 mg total) by mouth every 6 (six) hours as needed for muscle spasms. 06/05/18   Alvira Monday, MD  Vitamin D, Ergocalciferol, (DRISDOL) 1.25 MG (50000 UT) CAPS capsule Take 50,000 Units by mouth every 7 (seven) days.  08/13/16   [provider]    Allergies    Patient has no known allergies.  Review of Systems   Review of Systems  Constitutional: Negative for fever.  HENT: Positive for congestion. Negative for sore throat (reports improved, will have some pinching irritation with cough).   Eyes: Negative for visual disturbance.  Respiratory: Positive for cough. Negative for shortness of breath.   Cardiovascular: Negative for chest pain.  Gastrointestinal: Negative for abdominal pain,  diarrhea, nausea and vomiting.  Musculoskeletal: Positive for back pain. Negative for neck pain.  Skin: Negative for rash.  Neurological: Negative for syncope and headaches.    Physical Exam Updated Vital Signs BP (!) 148/81   Pulse 63   Temp 97.9 F (36.6 C) (Oral)   Resp 16   Ht 5' 7.5" (1.715 m)   Wt 88.5 kg   SpO2 99%   BMI 30.09 kg/m   Physical Exam Vitals and nursing note reviewed.  Constitutional:      General: She is not in acute distress.    Appearance: She is well-developed. She is not diaphoretic.  HENT:  Head: Normocephalic and atraumatic.  Eyes:     Conjunctiva/sclera: Conjunctivae normal.  Cardiovascular:     Rate and Rhythm: Normal rate and regular rhythm.     Heart sounds: Normal heart sounds. No murmur heard. No friction rub. No gallop.   Pulmonary:     Effort: Pulmonary effort is normal. No respiratory distress.     Breath sounds: Normal breath sounds. No wheezing or rales.  Abdominal:     General: There is no distension.     Palpations: Abdomen is soft.     Tenderness: There is no abdominal tenderness. There is no guarding.  Musculoskeletal:        General: No tenderness.     Cervical back: Normal range of motion.  Skin:    General: Skin is warm and dry.     Findings: No erythema or rash.  Neurological:     Mental Status: She is alert and oriented to person, place, and time.     ED Results / Procedures / Treatments   Labs (all labs ordered are listed, but only abnormal results are displayed) Labs Reviewed - No data to display  EKG EKG Interpretation  Date/Time:  Saturday November 05 2020 12:31:13 EDT Ventricular Rate:  77 PR Interval:  142 QRS Duration: 89 QT Interval:  380 QTC Calculation: 430 R Axis:   53 Text Interpretation: Sinus rhythm Borderline T abnormalities, inferior leads Unchanged inferior TW abnormality in comparison to prior Confirmed by Alvira Monday ([id number]) on 11/05/2020 12:37:59 PM   Radiology DG Chest 2  View  Result Date: 11/05/2020 CLINICAL DATA:  Cough for 2 weeks. EXAM: CHEST - 2 VIEW COMPARISON:  August 14, 2019 FINDINGS: The heart size and mediastinal contours are within normal limits. Both lungs are clear. The visualized skeletal structures are unremarkable. IMPRESSION: No active cardiopulmonary disease. Electronically Signed   By: Sherian Rein M.D.   On: 11/05/2020 13:12    Procedures Procedures   Medications Ordered in ED Medications - No data to display  ED Course  I have reviewed the triage vital signs and the nursing notes.  Pertinent labs & imaging results that were available during my care of the patient were reviewed by me and considered in my medical decision making (see chart for details).    MDM Rules/Calculators/A&P                              65 year old female with a history of diabetes, hyperlipidemia, hypertension, recent diagnosis of sarcoidosis who presents with concern for cough for 2 weeks.   EKG obtained does not show change in comparison to prior.  CXR obtained shows no sign of CHF, no pneumonia, no pneumothorax.  Have lower suspicion for PE without persisting dyspnea, no hypoxia, no tachypnea, no tachycardia, no asymmetric leg pain or swelling, and with presence of nasal congestion with primary cough.    Possible allergic type bronchitis given high pollen, vs viral bronchitis, vs cough related to sarcoidosis.  Discussed that steroids may have effects on her glucose but may help with symptoms.  Given short rx of prednisone.  Patient discharged in stable condition with understanding of reasons to return.    Final Clinical Impression(s) / ED Diagnoses Final diagnoses:  Cough  Acute bronchitis, unspecified organism  History of sarcoidosis    Rx / DC Orders ED Discharge Orders         Ordered    predniSONE (DELTASONE) 10 MG tablet  Daily        11/05/20 1255           Alvira Monday, MD 11/05/20 1546

## 2020-11-05 NOTE — ED Triage Notes (Signed)
Reports cough x 2 wks- has been seen by MD x 2, tx w/ abx, cough is not improved. Sts cough is dry and worse at night.

## 2021-07-10 IMAGING — US US EXTREM LOW VENOUS*L*
1 series · 13 of 24 positions shown · non-contrast
Comparison: None.

CLINICAL DATA: Left leg pain and swelling



[Series 1: us extrem low venous*left* · 13 of 29 slices shown]
[im 1/29]
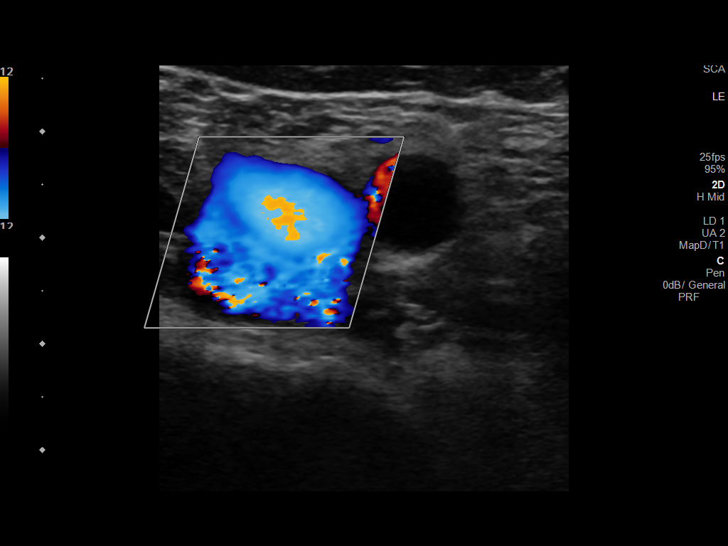
[im 3/29]
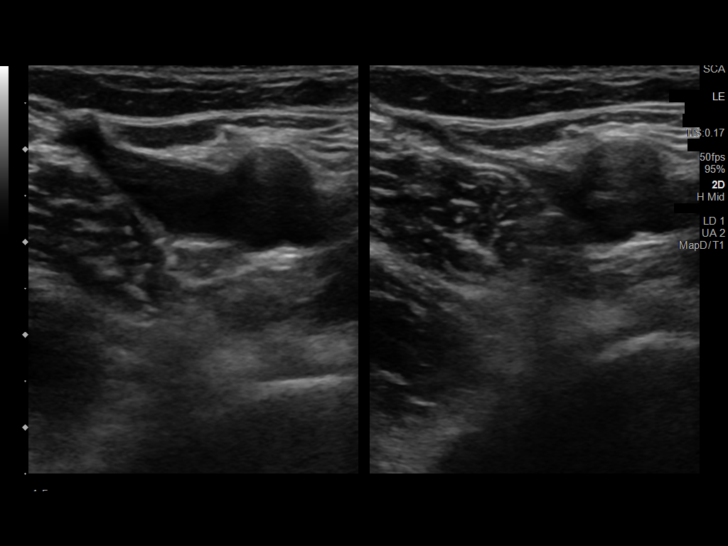
[im 5/29]
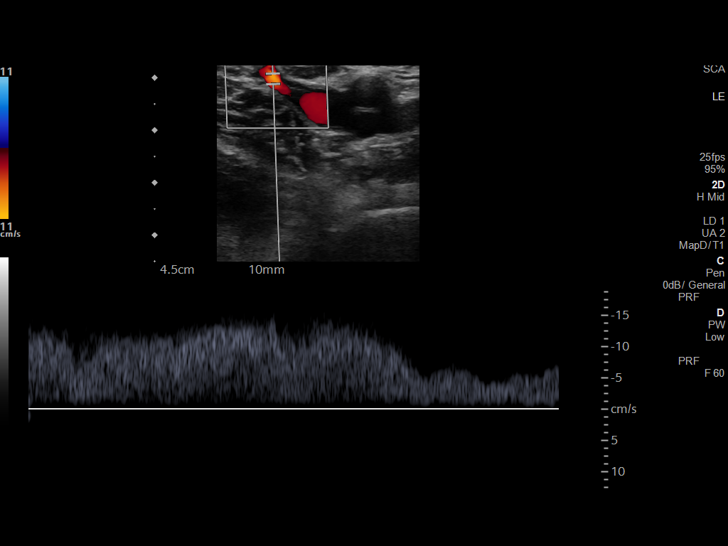
[im 8/29]
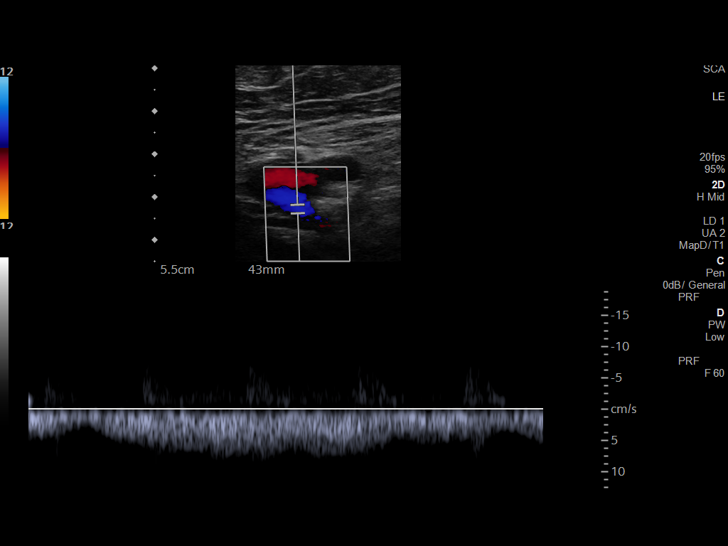
[im 10/29]
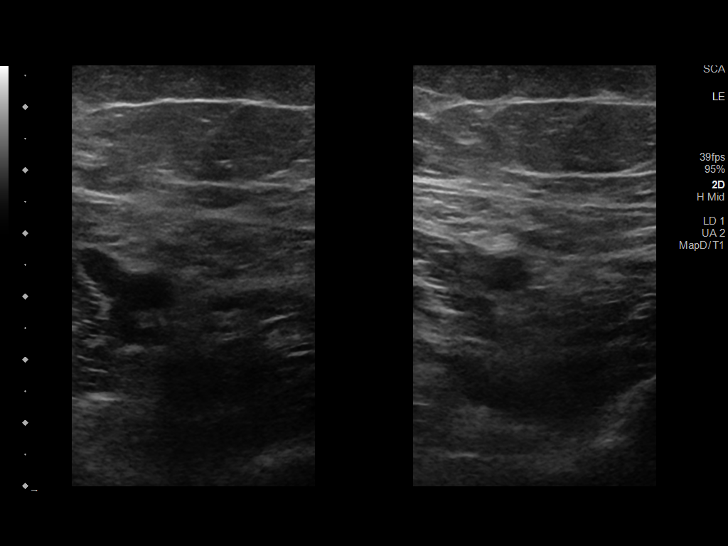
[im 13/29]
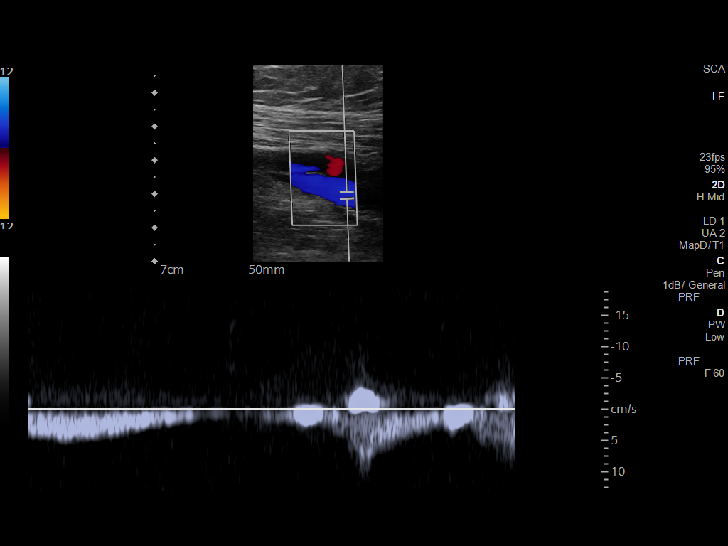
[im 15/29]
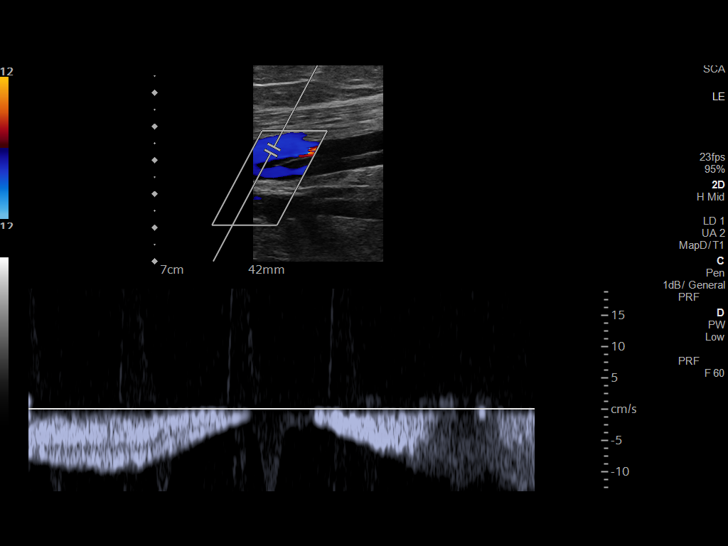
[im 16/29]
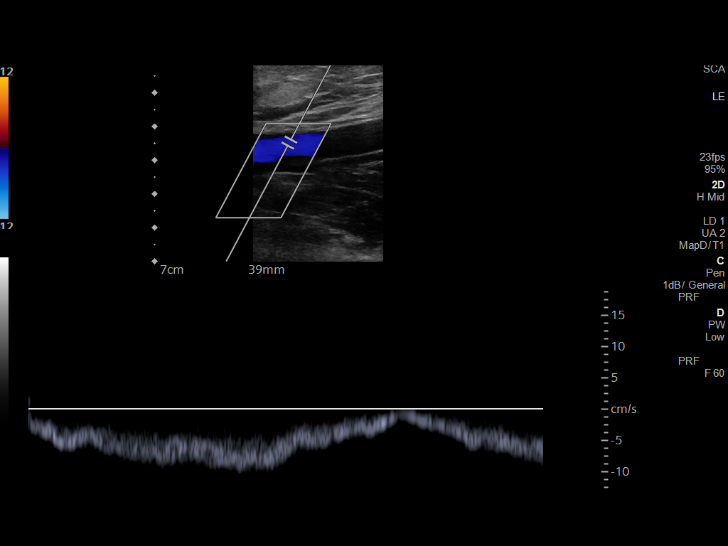
[im 19/29]
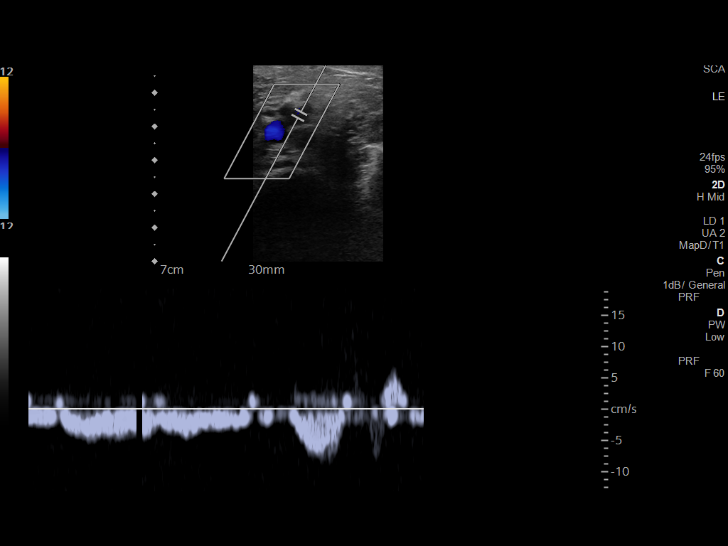
[im 21/29]
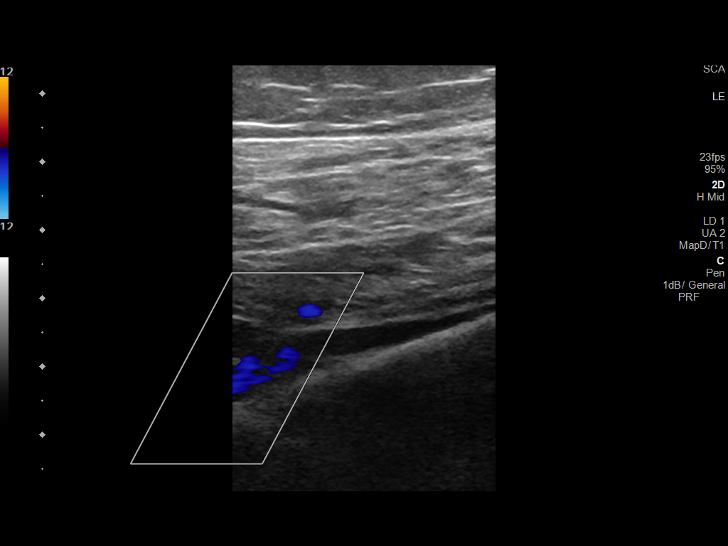
[im 24/29]
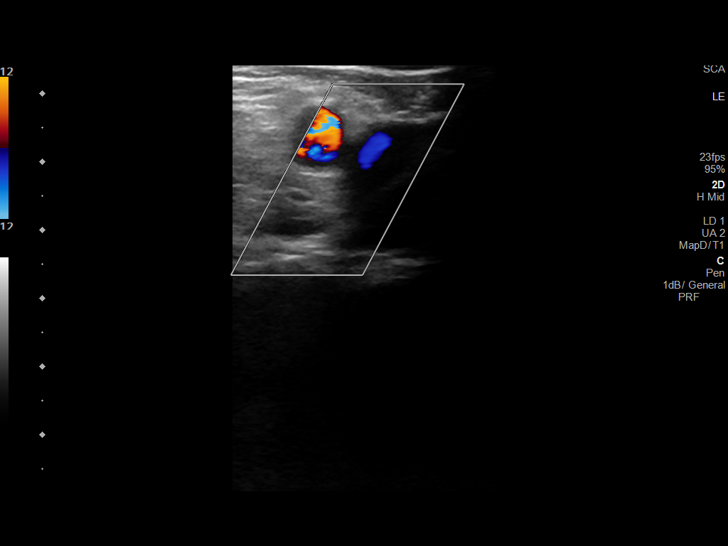
[im 26/29]
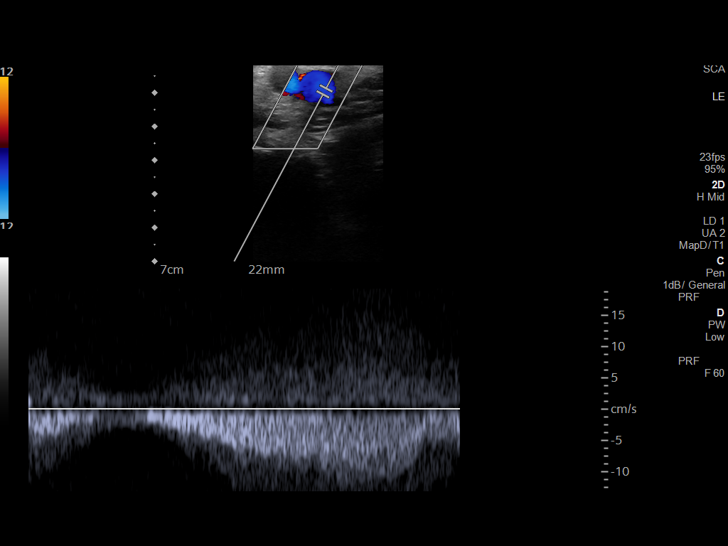
[im 29/29]
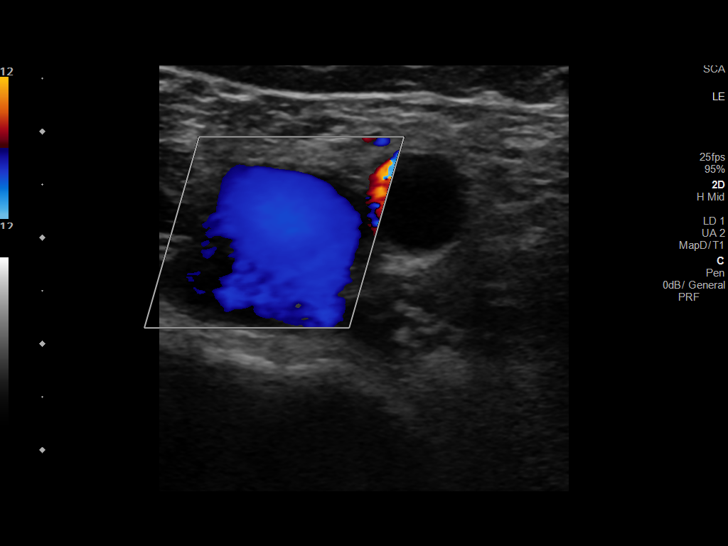

[13 of 24 positions shown; findings below may reference images not displayed]

FINDINGS: Contralateral Common Femoral Vein: Respiratory phasicity is normal
and symmetric with the symptomatic side. No evidence of thrombus.
Normal compressibility.

Common Femoral Vein: No evidence of thrombus. Normal
compressibility, respiratory phasicity and response to augmentation.

Saphenofemoral Junction: No evidence of thrombus. Normal
compressibility and flow on color Doppler imaging.

Profunda Femoral Vein: No evidence of thrombus. Normal
compressibility and flow on color Doppler imaging.

Femoral Vein: No evidence of thrombus. Normal compressibility,
respiratory phasicity and response to augmentation.

Popliteal Vein: No evidence of thrombus. Normal compressibility,
respiratory phasicity and response to augmentation.

Calf Veins: No evidence of thrombus. Normal compressibility and flow
on color Doppler imaging.

Superficial Great Saphenous Vein: No evidence of thrombus. Normal
compressibility.

Venous Reflux:  None.

Other Findings:  None.
IMPRESSION: No evidence of deep venous thrombosis.

## 2021-07-11 IMAGING — CT CT HEART MORP W/ CTA COR W/ SCORE W/ CA W/CM &/OR W/O CM
4 of 7 series · 8 of 20 positions shown, 9 images · IV contrast (omnipaque)
Comparison: None.

Addendum:
HISTORY: Chest pain, acute, high prob, ACS suspected

EXAM:
Cardiac/Coronary  CT
TECHNIQUE: The patient was scanned on a Siemens Force scanner.
PROTOCOL: A 120 kV prospective scan was triggered in the descending thoracic
aorta at 111 HU's. Axial non-contrast 3 mm slices were carried out
through the heart. The data set was analyzed on a dedicated work
station and scored using the Agatson method. Gantry rotation speed
was 250 msecs and collimation was .6 mm. Beta blockade and 0.8 mg of
sl NTG was given. The 3D data set was reconstructed in 5% intervals
of the 67-82 % of the R-R cycle. Diastolic phases were analyzed on a
dedicated work station using MPR, MIP and VRT modes. The patient
received 80mL OMNIPAQUE IOHEXOL 350 MG/ML SOLN of contrast.

[Series 6: best diast 77 % · axial · 0.35mm/px · z∈[+1108,+1154]mm · 2 of 346 slices shown, 3 images]
[im 116/346  vessel]
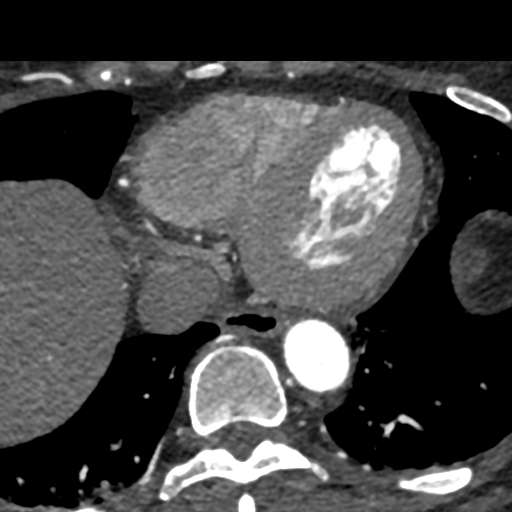
[im 116/346  lung]
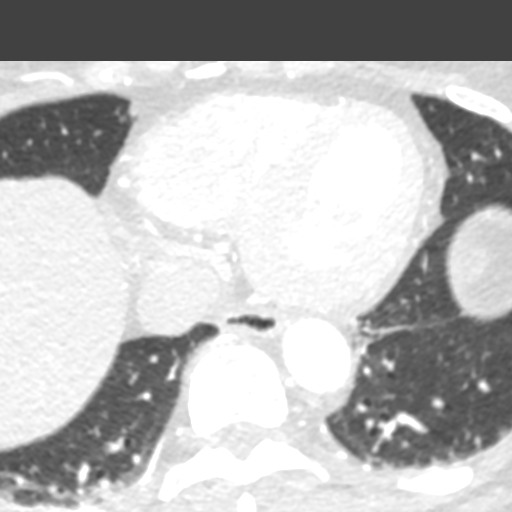
[im 231/346  vessel]
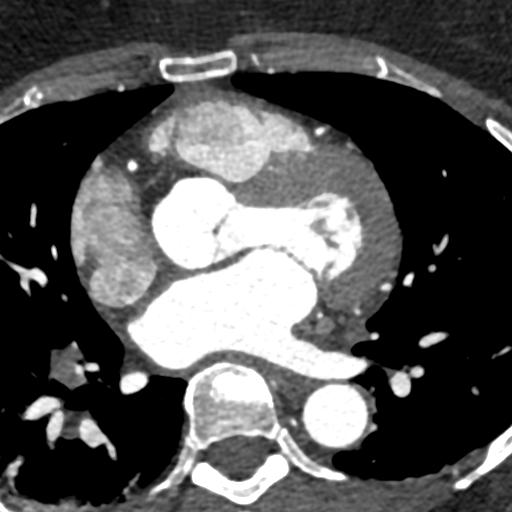

[Series 7: best syst 42 % · axial · 0.35mm/px · z∈[+1108,+1154]mm · 2 of 346 slices shown]
[im 116/346  vessel]
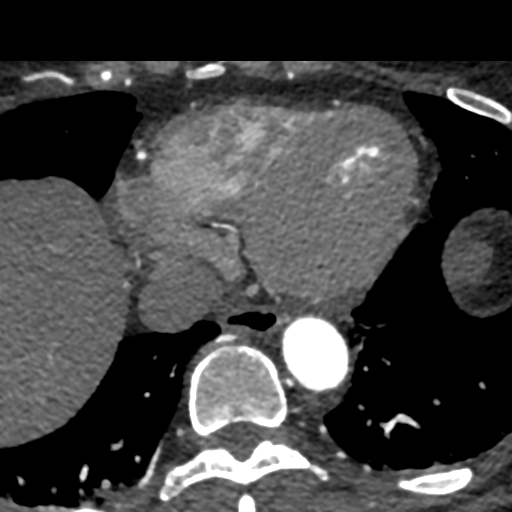
[im 231/346  vessel]
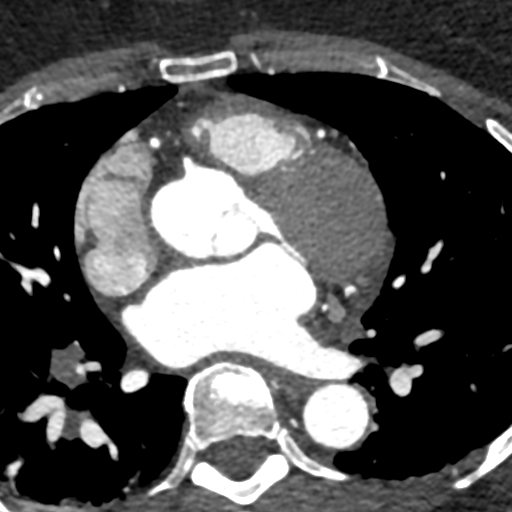

[Series 8: ts diast sharp 42 % · axial · 0.35mm/px · z∈[+1108,+1154]mm · 2 of 346 slices shown]
[im 116/346  lung]
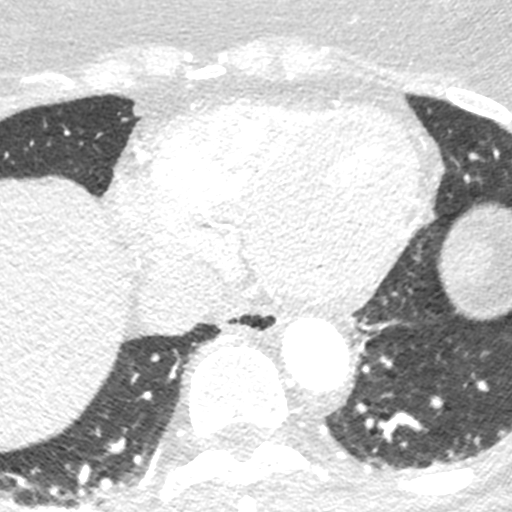
[im 231/346  lung]
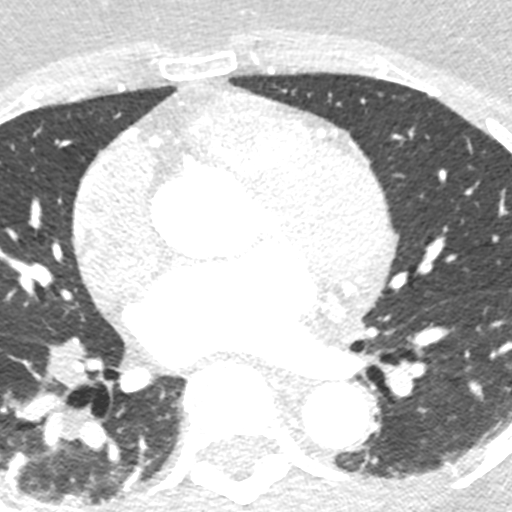

[Series 9: ts syst sharp 42 % · axial · 0.35mm/px · z∈[+1108,+1154]mm · 2 of 346 slices shown]
[im 116/346  lung]
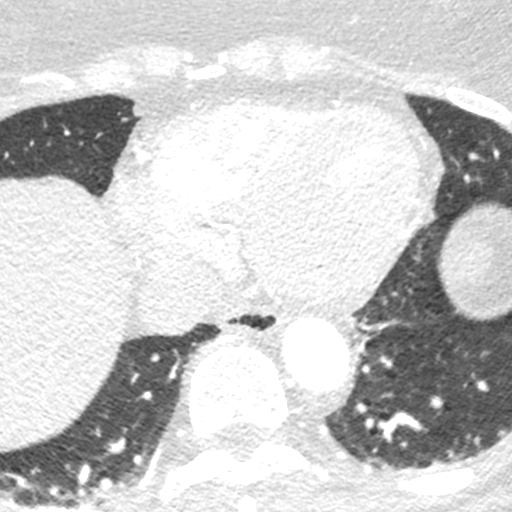
[im 231/346  lung]
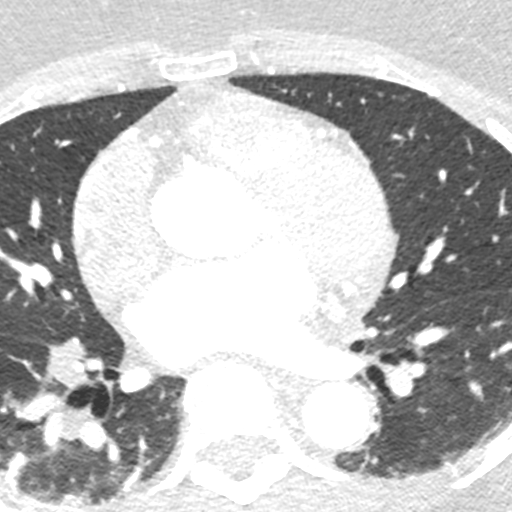

[8 of 20 positions shown; findings below may reference images not displayed]

FINDINGS: Coronary calcium score: The patient's coronary artery calcium score
is 0, which places the patient in the 0 percentile.

Coronary arteries: Normal coronary origins.  Right dominance.

Right Coronary Artery: No detectable plaque or stenosis.

Left Main Coronary Artery: No detectable plaque or stenosis.

Left Anterior Descending Coronary Artery: No detectable plaque or
stenosis.

Left Circumflex Artery: No detectable plaque or stenosis.

Aorta: Normal size, 32 x 32 x 33 mm at sinuses of Valsalva, 34 mm at
the mid ascending aorta (level of the PA bifurcation) measured
double oblique. No calcifications. No dissection.

Aortic Valve: No calcifications.  Trileaflet aortic valve.

Other findings:

Normal pulmonary vein drainage into the left atrium.

Normal left atrial appendage without a thrombus.

Mildly dilated main pulmonary artery, 32 mm.

Mild concentric left ventricular hypertrophy, 12 mm.

Grossly preserved left ventricular systolic function.
IMPRESSION: 1. No evidence of CAD, CADRADS = 0.

2. Coronary calcium score of 0. This was 0 percentile for age and
sex matched control.

3. Normal coronary origin with right dominance.

4. Mildly dilated main pulmonary artery, 32 mm may suggest increased
pulmonary artery pressure.

5. Mild concentric left ventricular hypertrophy, 12 mm wall
thickness.

EXAM:
OVER-READ INTERPRETATION  CT CHEST

The following report is an over-read performed by radiologist Dr.
over-read does not include interpretation of cardiac or coronary
anatomy or pathology. The coronary CTA interpretation by the
cardiologist is attached.
FINDINGS: Limited view of the lung parenchyma demonstrates no suspicious
nodularity. Airways are normal.

Limited view of the mediastinum demonstrates bilateral hilar
lymphadenopathy. For example RIGHT hilar lymph node measuring 12 mm
on image [DATE]. RIGHT infrahilar lymph node measuring 12 mm. LEFT
hilar lymph node measuring 10 mm (image [DATE]). LEFT suprahilar
hilar lymph node measuring 9 mm (image [DATE]) compares to 8 mm on
comparison neck CT from 06/05/2018. This hilar activity is
symmetric.

Subcarinal lymph node measuring 14 mm.  Esophagus normal.

Limited view of the upper abdomen unremarkable.

Limited view of the skeleton and chest wall is unremarkable.
IMPRESSION: Symmetric hilar lymphadenopathy and subcarinal adenopathy. Pattern
is suggestive of granulomatous disease including sarcoidosis.
Recommend clinical correlation for B symptoms to indicate less
favored lymphoproliferative disorder. Recommend dedicated contrast
CT chest further evaluation and serve as baseline for potential
follow-up.

These results will be called to the ordering clinician or
representative by the Radiologist Assistant, and communication
documented in the PACS or zVision Dashboard.

*** End of Addendum ***
FINDINGS: Coronary calcium score: The patient's coronary artery calcium score
is 0, which places the patient in the 0 percentile.

Coronary arteries: Normal coronary origins.  Right dominance.

Right Coronary Artery: No detectable plaque or stenosis.

Left Main Coronary Artery: No detectable plaque or stenosis.

Left Anterior Descending Coronary Artery: No detectable plaque or
stenosis.

Left Circumflex Artery: No detectable plaque or stenosis.

Aorta: Normal size, 32 x 32 x 33 mm at sinuses of Valsalva, 34 mm at
the mid ascending aorta (level of the PA bifurcation) measured
double oblique. No calcifications. No dissection.

Aortic Valve: No calcifications.  Trileaflet aortic valve.

Other findings:

Normal pulmonary vein drainage into the left atrium.

Normal left atrial appendage without a thrombus.

Mildly dilated main pulmonary artery, 32 mm.

Mild concentric left ventricular hypertrophy, 12 mm.

Grossly preserved left ventricular systolic function.
IMPRESSION: 1. No evidence of CAD, CADRADS = 0.

2. Coronary calcium score of 0. This was 0 percentile for age and
sex matched control.

3. Normal coronary origin with right dominance.

4. Mildly dilated main pulmonary artery, 32 mm may suggest increased
pulmonary artery pressure.

5. Mild concentric left ventricular hypertrophy, 12 mm wall
thickness.

## 2022-06-29 ENCOUNTER — Other Ambulatory Visit (HOSPITAL_BASED_OUTPATIENT_CLINIC_OR_DEPARTMENT_OTHER): Payer: Self-pay

## 2022-06-29 ENCOUNTER — Encounter (HOSPITAL_BASED_OUTPATIENT_CLINIC_OR_DEPARTMENT_OTHER): Payer: Self-pay | Admitting: Emergency Medicine

## 2022-06-29 ENCOUNTER — Emergency Department (HOSPITAL_BASED_OUTPATIENT_CLINIC_OR_DEPARTMENT_OTHER)
Admission: EM | Admit: 2022-06-29 | Discharge: 2022-06-29 | Disposition: A | Discharge status: Home / Self Care | Payer: Medicare Other | Attending: Emergency Medicine | Admitting: Emergency Medicine

## 2022-06-29 ENCOUNTER — Other Ambulatory Visit: Payer: Self-pay

## 2022-06-29 DIAGNOSIS — E1165 Type 2 diabetes mellitus with hyperglycemia: Secondary | ICD-10-CM | POA: Diagnosis not present

## 2022-06-29 DIAGNOSIS — R350 Frequency of micturition: Secondary | ICD-10-CM | POA: Diagnosis present

## 2022-06-29 DIAGNOSIS — N390 Urinary tract infection, site not specified: Secondary | ICD-10-CM | POA: Insufficient documentation

## 2022-06-29 DIAGNOSIS — I10 Essential (primary) hypertension: Secondary | ICD-10-CM | POA: Diagnosis not present

## 2022-06-29 DIAGNOSIS — E119 Type 2 diabetes mellitus without complications: Secondary | ICD-10-CM | POA: Insufficient documentation

## 2022-06-29 DIAGNOSIS — Z7985 Long-term (current) use of injectable non-insulin antidiabetic drugs: Secondary | ICD-10-CM | POA: Insufficient documentation

## 2022-06-29 DIAGNOSIS — R739 Hyperglycemia, unspecified: Secondary | ICD-10-CM

## 2022-06-29 DIAGNOSIS — Z7984 Long term (current) use of oral hypoglycemic drugs: Secondary | ICD-10-CM | POA: Insufficient documentation

## 2022-06-29 DIAGNOSIS — Z79899 Other long term (current) drug therapy: Secondary | ICD-10-CM | POA: Diagnosis not present

## 2022-06-29 DIAGNOSIS — Z7982 Long term (current) use of aspirin: Secondary | ICD-10-CM | POA: Insufficient documentation

## 2022-06-29 LAB — CBC
HCT: 38.4 % (ref 36.0–46.0)
Hemoglobin: 12.1 g/dL (ref 12.0–15.0)
MCH: 25.7 pg — ABNORMAL LOW (ref 26.0–34.0)
MCHC: 31.5 g/dL (ref 30.0–36.0)
MCV: 81.5 fL (ref 80.0–100.0)
Platelets: 285 10*3/uL (ref 150–400)
RBC: 4.71 MIL/uL (ref 3.87–5.11)
RDW: 14.7 % (ref 11.5–15.5)
WBC: 5.1 10*3/uL (ref 4.0–10.5)
nRBC: 0 % (ref 0.0–0.2)

## 2022-06-29 LAB — URINALYSIS, ROUTINE W REFLEX MICROSCOPIC
Bilirubin Urine: NEGATIVE
Glucose, UA: >=500 mg/dL — AB
Ketones, ur: NEGATIVE mg/dL
Leukocytes,Ua: NEGATIVE
Nitrite: NEGATIVE
Protein, ur: NEGATIVE mg/dL
Specific Gravity, Urine: 1.025 (ref 1.005–1.030)
pH: 5.5 (ref 5.0–8.0)

## 2022-06-29 LAB — BASIC METABOLIC PANEL
Anion gap: 6 (ref 5–15)
BUN: 13 mg/dL (ref 8–23)
CO2: 30 mmol/L (ref 22–32)
Calcium: 9.4 mg/dL (ref 8.9–10.3)
Chloride: 102 mmol/L (ref 98–111)
Creatinine, Ser: 0.85 mg/dL (ref 0.44–1.00)
GFR, Estimated: >60 mL/min (ref >60)
Glucose, Bld: 368 mg/dL — ABNORMAL HIGH (ref 70–99)
Potassium: 4 mmol/L (ref 3.5–5.1)
Sodium: 138 mmol/L (ref 135–145)

## 2022-06-29 LAB — URINALYSIS, MICROSCOPIC (REFLEX)

## 2022-06-29 LAB — CBG MONITORING, ED: Glucose-Capillary: 274 mg/dL — ABNORMAL HIGH (ref 70–99)

## 2022-06-29 MED ORDER — SODIUM CHLORIDE 0.9 % IV SOLN
1.0000 g | Freq: Once | INTRAVENOUS | Status: AC
  Administered 2022-06-29: 1 g via INTRAVENOUS
  Filled 2022-06-29: qty 10

## 2022-06-29 MED ORDER — SODIUM CHLORIDE 0.9 % IV BOLUS
1000.0000 mL | Freq: Once | INTRAVENOUS | Status: AC
  Administered 2022-06-29: 1000 mL via INTRAVENOUS

## 2022-06-29 MED ORDER — LEVOFLOXACIN 750 MG PO TABS
750.0000 mg | ORAL_TABLET | Freq: Every day | ORAL | 0 refills | Status: AC
  Filled 2022-06-29: qty 5, 5d supply, fill #0

## 2022-06-29 MED ORDER — INSULIN ASPART 100 UNIT/ML IJ SOLN
8.0000 [IU] | Freq: Once | INTRAMUSCULAR | Status: AC
  Administered 2022-06-29: 8 [IU] via SUBCUTANEOUS

## 2022-06-29 NOTE — ED Provider Notes (Signed)
[facility] Provider Note   CSN: [id number] Arrival date & time: 06/29/22  [id number]     History  Chief Complaint  Patient presents with   Urinary Frequency    Martha Sherman is a 66 y.o. female.  Pt c/o urinary frequency and urgency in the past two weeks. Symptoms acute onset, moderate, persistent. States her doctor put he on a course of keflex but no different. No fever or chills. Mild suprapubic and bil mid to lower back/flank  discomfort. No severe back/flank pain or radicular pain. Hx dm, has not checked blood sugar recently. No vomiting. Having normal bms. Normal appetite.   The history is provided by the patient and medical records.  Urinary Frequency Pertinent negatives include no chest pain, no abdominal pain, no headaches and no shortness of breath.       Home Medications Prior to Admission medications   Medication Sig Start Date End Date Taking? Authorizing Provider  acetaminophen (TYLENOL) 500 MG tablet Take 500 mg by mouth as needed. For pain    [provider]  albuterol (VENTOLIN HFA) 108 (90 Base) MCG/ACT inhaler Inhale 2 puffs into the lungs 4 (four) times daily as needed for wheezing. 05/23/19   [provider]  amLODipine (NORVASC) 10 MG tablet Take 10 mg by mouth daily.    [provider]  aspirin EC 81 MG tablet Take 81 mg by mouth.    [provider]  diclofenac (VOLTAREN) 75 MG EC tablet Take 1 tablet (75 mg total) by mouth 2 (two) times daily. 05/23/18   Wallene Huh, DPM  diclofenac (VOLTAREN) 75 MG EC tablet Take 1 tablet (75 mg total) by mouth 2 (two) times daily. 09/26/18   Wallene Huh, DPM  diclofenac (VOLTAREN) 75 MG EC tablet Take 1 tablet (75 mg total) by mouth 2 (two) times daily. 12/10/18   Wallene Huh, DPM  Dulaglutide 0.75 MG/0.5ML SOPN Inject 0.75 mg into the skin once a week.     [provider]  gabapentin (NEURONTIN) 300 MG capsule Take 300 mg by mouth as needed  (pain). 07/31/16   [provider]  losartan-hydrochlorothiazide (HYZAAR) 100-25 MG per tablet Take 1 tablet by mouth daily.    [provider]  metFORMIN (GLUCOPHAGE-XR) 500 MG 24 hr tablet Take 1,000 mg by mouth in the morning and at bedtime.  09/23/18   [provider]  Multiple Vitamin (MULTIVITAMIN WITH MINERALS) TABS tablet Take 1 tablet by mouth daily.    [provider]  omeprazole (PRILOSEC) 20 MG capsule Take 20 mg by mouth daily as needed (acid reflux).    [provider]  [facility] VERIO test strip  09/12/18   [provider]  pregabalin (LYRICA) 75 MG capsule Take 1 capsule (75 mg total) by mouth 2 (two) times daily. 07/05/15   Melvenia Beam, MD  rosuvastatin (CRESTOR) 5 MG tablet Take 5 mg by mouth daily.  09/12/18   [provider]  tiZANidine (ZANAFLEX) 2 MG tablet Take 1 tablet (2 mg total) by mouth every 6 (six) hours as needed for muscle spasms. 06/05/18   Gareth Morgan, MD  Vitamin D, Ergocalciferol, (DRISDOL) 1.25 MG (50000 UT) CAPS capsule Take 50,000 Units by mouth every 7 (seven) days.  08/13/16   [provider]      Allergies    Patient has no known allergies.    Review of Systems   Review of Systems  Constitutional:  Negative for chills and  fever.  HENT:  Negative for sore throat.   Eyes:  Negative for redness.  Respiratory:  Negative for cough and shortness of breath.   Cardiovascular:  Negative for chest pain.  Gastrointestinal:  Negative for abdominal pain and vomiting.  Genitourinary:  Positive for frequency. Negative for dysuria and flank pain.  Musculoskeletal:  Positive for back pain. Negative for myalgias and neck pain.  Skin:  Negative for rash.  Neurological:  Negative for headaches.  Hematological:  Does not bruise/bleed easily.  Psychiatric/Behavioral:  Negative for confusion.     Physical Exam Updated Vital Signs BP (!) 167/73 (BP Location: Left Arm)   Pulse 81   Temp 97.9  F (36.6 C) (Oral)   Resp 16   Ht 1.702 m (5\' 7" )   Wt 87.5 kg   SpO2 97%   BMI 30.23 kg/m  Physical Exam Vitals and nursing note reviewed.  Constitutional:      Appearance: Normal appearance. She is well-developed.  HENT:     Head: Atraumatic.     Nose: Nose normal.     Mouth/Throat:     Mouth: Mucous membranes are moist.  Eyes:     General: No scleral icterus.    Conjunctiva/sclera: Conjunctivae normal.  Neck:     Trachea: No tracheal deviation.  Cardiovascular:     Rate and Rhythm: Normal rate and regular rhythm.     Pulses: Normal pulses.     Heart sounds: Normal heart sounds. No murmur heard.    No friction rub. No gallop.  Pulmonary:     Effort: Pulmonary effort is normal. No respiratory distress.     Breath sounds: Normal breath sounds.  Abdominal:     General: Bowel sounds are normal. There is no distension.     Palpations: Abdomen is soft. There is no mass.     Tenderness: There is no abdominal tenderness. There is no guarding.  Genitourinary:    Comments: No severe/significant cva tenderness.  Musculoskeletal:        General: No swelling.     Cervical back: Normal range of motion and neck supple. No rigidity. No muscular tenderness.     Comments: T/l/s spine non tender, aligned. No sts noted.   Skin:    General: Skin is warm and dry.     Findings: No rash.  Neurological:     Mental Status: She is alert.     Comments: Alert, speech normal. Steady gait.   Psychiatric:        Mood and Affect: Mood normal.     ED Results / Procedures / Treatments   Labs (all labs ordered are listed, but only abnormal results are displayed) Results for orders placed or performed during the hospital encounter of 06/29/22  Urinalysis, Routine w reflex microscopic Urine, Clean Catch  Result Value Ref Range   Color, Urine YELLOW YELLOW   APPearance CLEAR CLEAR   Specific Gravity, Urine 1.025 1.005 - 1.030   pH 5.5 5.0 - 8.0   Glucose, UA >=500 (A) NEGATIVE mg/dL   Hgb  urine dipstick TRACE (A) NEGATIVE   Bilirubin Urine NEGATIVE NEGATIVE   Ketones, ur NEGATIVE NEGATIVE mg/dL   Protein, ur NEGATIVE NEGATIVE mg/dL   Nitrite NEGATIVE NEGATIVE   Leukocytes,Ua NEGATIVE NEGATIVE  Basic metabolic panel  Result Value Ref Range   Sodium 138 135 - 145 mmol/L   Potassium 4.0 3.5 - 5.1 mmol/L   Chloride 102 98 - 111 mmol/L   CO2 30 22 - 32 mmol/L  Glucose, Bld 368 (H) 70 - 99 mg/dL   BUN 13 8 - 23 mg/dL   Creatinine, Ser 0.85 0.44 - 1.00 mg/dL   Calcium 9.4 8.9 - 10.3 mg/dL   GFR, Estimated >60 >60 mL/min   Anion gap 6 5 - 15  CBC  Result Value Ref Range   WBC 5.1 4.0 - 10.5 K/uL   RBC 4.71 3.87 - 5.11 MIL/uL   Hemoglobin 12.1 12.0 - 15.0 g/dL   HCT 38.4 36.0 - 46.0 %   MCV 81.5 80.0 - 100.0 fL   MCH 25.7 (L) 26.0 - 34.0 pg   MCHC 31.5 30.0 - 36.0 g/dL   RDW 14.7 11.5 - 15.5 %   Platelets 285 150 - 400 K/uL   nRBC 0.0 0.0 - 0.2 %  Urinalysis, Microscopic (reflex)  Result Value Ref Range   RBC / HPF 6-10 0 - 5 RBC/hpf   WBC, UA 21-50 0 - 5 WBC/hpf   Bacteria, UA MANY (A) NONE SEEN   Squamous Epithelial / LPF 0-5 0 - 5   Non Squamous Epithelial PRESENT (A) NONE SEEN   Mucus PRESENT   POC CBG, ED  Result Value Ref Range   Glucose-Capillary 274 (H) 70 - 99 mg/dL     EKG None  Radiology No results found.  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 Labs: ordered. Decision-making details documented in ED Course.  Risk Prescription drug management. Decision regarding hospitalization.   Labs sent.  Diff dx includes uti, dehydration, aki, hyperglycemia, etc - dispo decision including potential need for admission considered if aki, or other significant lab abn - will get labs, give fluids, and reassess.   Labs reviewed/interpreted by me - ua w many bact and wbc. Chem w high glucose, hx dm. Hco3 normal. Ns bolus. Novolog sq.   Reviewed nursing notes and prior charts for additional history.   Pt indicates in past with uti has taken quinolone med w good results and no adverse effect.   Recheck blood sugar improved from prior. Tolerating po. No nv. Abd soft nt.   Pt indicates has adequate of her bp meds/meds at home.   Rx for abx provided.  Rec close pcp f/u re dm and htn, as well as uti.  Return precautions provided.           Final Clinical Impression(s) / ED Diagnoses Final diagnoses:  None    Rx / DC Orders ED Discharge Orders     None         Lajean Saver, MD 06/29/22 [phone]

## 2022-06-29 NOTE — ED Triage Notes (Signed)
States for the past month has been having frequency in urination and lower back pain. Took keflex without any change in symptoms, x 2 weeks ago

## 2022-06-29 NOTE — Discharge Instructions (Addendum)
It was our pleasure to provide your ER care today - we hope that you feel better.  Drink plenty of fluids/stay well hydrated.   For urine infection, take levaquin (antibiotic) as prescribed.  Your blood sugar is high today (368) - drink plenty of water/fluids, continue diabetes meds, follow diabetes eating plan, monitor sugars 4x/day and record values, and contact your doctor today to discuss whether they want to adjust your diabetes medication program. Also follow up with your doctor regarding your blood pressure that is high today.   Return to ER if worse, new symptoms, new or worsening or severe abdominal pain, persistent vomiting, weak/fainting, or other concern.

## 2022-06-29 NOTE — ED Notes (Signed)
ED Provider at bedside. 

## 2023-01-31 ENCOUNTER — Encounter (HOSPITAL_BASED_OUTPATIENT_CLINIC_OR_DEPARTMENT_OTHER): Payer: Self-pay | Admitting: Neurosurgery

## 2023-02-04 ENCOUNTER — Other Ambulatory Visit (HOSPITAL_BASED_OUTPATIENT_CLINIC_OR_DEPARTMENT_OTHER): Payer: Self-pay | Admitting: Neurosurgery

## 2023-02-04 DIAGNOSIS — M8668 Other chronic osteomyelitis, other site: Secondary | ICD-10-CM

## 2023-10-30 ENCOUNTER — Other Ambulatory Visit (HOSPITAL_BASED_OUTPATIENT_CLINIC_OR_DEPARTMENT_OTHER): Payer: Self-pay | Admitting: Neurosurgery

## 2023-10-30 DIAGNOSIS — G935 Compression of brain: Secondary | ICD-10-CM

## 2024-01-11 ENCOUNTER — Emergency Department (HOSPITAL_BASED_OUTPATIENT_CLINIC_OR_DEPARTMENT_OTHER)
Admission: EM | Admit: 2024-01-11 | Discharge: 2024-01-11 | Disposition: A | Discharge status: Home / Self Care | Attending: Emergency Medicine | Admitting: Emergency Medicine

## 2024-01-11 ENCOUNTER — Emergency Department (HOSPITAL_BASED_OUTPATIENT_CLINIC_OR_DEPARTMENT_OTHER)

## 2024-01-11 ENCOUNTER — Other Ambulatory Visit: Payer: Self-pay

## 2024-01-11 ENCOUNTER — Encounter (HOSPITAL_BASED_OUTPATIENT_CLINIC_OR_DEPARTMENT_OTHER): Payer: Self-pay | Admitting: Emergency Medicine

## 2024-01-11 DIAGNOSIS — I10 Essential (primary) hypertension: Secondary | ICD-10-CM | POA: Insufficient documentation

## 2024-01-11 DIAGNOSIS — R0602 Shortness of breath: Secondary | ICD-10-CM | POA: Diagnosis present

## 2024-01-11 DIAGNOSIS — Z7984 Long term (current) use of oral hypoglycemic drugs: Secondary | ICD-10-CM | POA: Insufficient documentation

## 2024-01-11 DIAGNOSIS — R051 Acute cough: Secondary | ICD-10-CM | POA: Diagnosis not present

## 2024-01-11 DIAGNOSIS — Z79899 Other long term (current) drug therapy: Secondary | ICD-10-CM | POA: Diagnosis not present

## 2024-01-11 DIAGNOSIS — E119 Type 2 diabetes mellitus without complications: Secondary | ICD-10-CM | POA: Insufficient documentation

## 2024-01-11 DIAGNOSIS — R0600 Dyspnea, unspecified: Secondary | ICD-10-CM

## 2024-01-11 DIAGNOSIS — Z7982 Long term (current) use of aspirin: Secondary | ICD-10-CM | POA: Insufficient documentation

## 2024-01-11 LAB — PRO BRAIN NATRIURETIC PEPTIDE: Pro Brain Natriuretic Peptide: 121 pg/mL (ref <300.0)

## 2024-01-11 LAB — COMPREHENSIVE METABOLIC PANEL WITH GFR
ALT: 14 U/L (ref 0–44)
AST: 16 U/L (ref 15–41)
Albumin: 4.1 g/dL (ref 3.5–5.0)
Alkaline Phosphatase: 90 U/L (ref 38–126)
Anion gap: 14 (ref 5–15)
BUN: 11 mg/dL (ref 8–23)
CO2: 24 mmol/L (ref 22–32)
Calcium: 9.4 mg/dL (ref 8.9–10.3)
Chloride: 101 mmol/L (ref 98–111)
Creatinine, Ser: 0.87 mg/dL (ref 0.44–1.00)
GFR, Estimated: >60 mL/min (ref >60)
Glucose, Bld: 394 mg/dL — ABNORMAL HIGH (ref 70–99)
Potassium: 3.9 mmol/L (ref 3.5–5.1)
Sodium: 139 mmol/L (ref 135–145)
Total Bilirubin: 0.4 mg/dL (ref 0.0–1.2)
Total Protein: 7 g/dL (ref 6.5–8.1)

## 2024-01-11 LAB — CBC WITH DIFFERENTIAL/PLATELET
Abs Immature Granulocytes: 0.03 10*3/uL (ref 0.00–0.07)
Basophils Absolute: 0 10*3/uL (ref 0.0–0.1)
Basophils Relative: 0 %
Eosinophils Absolute: 0 10*3/uL (ref 0.0–0.5)
Eosinophils Relative: 0 %
HCT: 36 % (ref 36.0–46.0)
Hemoglobin: 11.7 g/dL — ABNORMAL LOW (ref 12.0–15.0)
Immature Granulocytes: 0 %
Lymphocytes Relative: 23 %
Lymphs Abs: 1.7 10*3/uL (ref 0.7–4.0)
MCH: 26.4 pg (ref 26.0–34.0)
MCHC: 32.5 g/dL (ref 30.0–36.0)
MCV: 81.3 fL (ref 80.0–100.0)
Monocytes Absolute: 0.5 10*3/uL (ref 0.1–1.0)
Monocytes Relative: 6 %
Neutro Abs: 5.2 10*3/uL (ref 1.7–7.7)
Neutrophils Relative %: 71 %
Platelets: 272 10*3/uL (ref 150–400)
RBC: 4.43 MIL/uL (ref 3.87–5.11)
RDW: 14.9 % (ref 11.5–15.5)
WBC: 7.5 10*3/uL (ref 4.0–10.5)
nRBC: 0 % (ref 0.0–0.2)

## 2024-01-11 LAB — TROPONIN T, HIGH SENSITIVITY
Troponin T High Sensitivity: <15 ng/L (ref <19)
Troponin T High Sensitivity: <15 ng/L (ref <19)

## 2024-01-11 LAB — RESP PANEL BY RT-PCR (RSV, FLU A&B, COVID)  RVPGX2
Influenza A by PCR: NEGATIVE
Influenza B by PCR: NEGATIVE
Resp Syncytial Virus by PCR: NEGATIVE
SARS Coronavirus 2 by RT PCR: NEGATIVE

## 2024-01-11 MED ORDER — ACETAMINOPHEN 500 MG PO TABS
1000.0000 mg | ORAL_TABLET | Freq: Once | ORAL | Status: AC
  Administered 2024-01-11: 1000 mg via ORAL
  Filled 2024-01-11: qty 2

## 2024-01-11 MED ORDER — NITROGLYCERIN 0.4 MG SL SUBL
0.4000 mg | SUBLINGUAL_TABLET | SUBLINGUAL | Status: DC | PRN
  Administered 2024-01-11: 0.4 mg via SUBLINGUAL
  Filled 2024-01-11: qty 1

## 2024-01-11 MED ORDER — ALBUTEROL SULFATE (2.5 MG/3ML) 0.083% IN NEBU: 2.5000 mg | INHALATION_SOLUTION | Freq: Four times a day (QID) | RESPIRATORY_TRACT | 12 refills | Status: AC | PRN

## 2024-01-11 MED ORDER — DEXAMETHASONE SODIUM PHOSPHATE 10 MG/ML IJ SOLN: 10.0000 mg | Freq: Once | INTRAMUSCULAR | Status: DC

## 2024-01-11 MED ORDER — DEXAMETHASONE 4 MG PO TABS
10.0000 mg | ORAL_TABLET | Freq: Once | ORAL | Status: AC
  Administered 2024-01-11: 10 mg via ORAL
  Filled 2024-01-11: qty 3

## 2024-01-11 MED ORDER — IOHEXOL 350 MG/ML SOLN
75.0000 mL | Freq: Once | INTRAVENOUS | Status: AC | PRN
  Administered 2024-01-11: 75 mL via INTRAVENOUS

## 2024-01-11 MED ORDER — BENZONATATE 100 MG PO CAPS: 100.0000 mg | ORAL_CAPSULE | Freq: Three times a day (TID) | ORAL | 0 refills | Status: AC

## 2024-01-11 NOTE — ED Provider Notes (Signed)
 [place] [facility] Provider Note   CSN: [id number] Arrival date & time: 01/11/24  1017     Patient presents with: Shortness of Breath   Martha Sherman is a 68 y.o. female.   HPI       68 year old female with a history of high cholesterol, diabetes, hypertension, history of Chiari malformation, who presents with concern for shortness of breath.  Reports over the last week she has had cough and progressive shortness of breath.  Reports that the cough is dry, nonproductive and worsening.  The shortness of breath is present all the time, does not matter if she is sitting up or laying down or exerting herself.  She denies any chest pain, fever, chills, abdominal pain.  Denies any nasal congestion or sore throat.  Denies sick contacts.  She did not take her blood pressure medication yet today.  Reports she does have an inhaler with a history of some seasonal allergies, but does not have any diagnosis of asthma or COPD.  No history of smoking.  She did try using her daughter's nebulizer machine and did feel some mild improvement of her symptoms.  Past Medical History:  Diagnosis Date   Acid reflux    Back pain    Diabetes mellitus    High cholesterol    History of Chiari malformation    Hypertension    Irregular heart beats    occassional   Murmur      Prior to Admission medications   Medication Sig Start Date End Date Taking? Authorizing Provider  albuterol  (PROVENTIL ) (2.5 MG/3ML) 0.083% nebulizer solution Take 3 mLs (2.5 mg total) by nebulization every 6 (six) hours as needed for wheezing or shortness of breath. 01/11/24  Yes Dreama Longs, MD  benzonatate  (TESSALON ) 100 MG capsule Take 1 capsule (100 mg total) by mouth every 8 (eight) hours. 01/11/24  Yes Dreama Longs, MD  acetaminophen  (TYLENOL ) 500 MG tablet Take 500 mg by mouth as needed. For pain    [provider]  albuterol  (VENTOLIN  HFA) 108 (90 Base) MCG/ACT  inhaler Inhale 2 puffs into the lungs 4 (four) times daily as needed for wheezing. 05/23/19   [provider]  amLODipine  (NORVASC ) 10 MG tablet Take 10 mg by mouth daily.    [provider]  aspirin  EC 81 MG tablet Take 81 mg by mouth.    [provider]  diclofenac  (VOLTAREN ) 75 MG EC tablet Take 1 tablet (75 mg total) by mouth 2 (two) times daily. 05/23/18   Magdalen Pasco RAMAN, DPM  diclofenac  (VOLTAREN ) 75 MG EC tablet Take 1 tablet (75 mg total) by mouth 2 (two) times daily. 09/26/18   Magdalen Pasco RAMAN, DPM  diclofenac  (VOLTAREN ) 75 MG EC tablet Take 1 tablet (75 mg total) by mouth 2 (two) times daily. 12/10/18   Magdalen Pasco RAMAN, DPM  Dulaglutide 0.75 MG/0.5ML SOPN Inject 0.75 mg into the skin once a week.     [provider]  gabapentin (NEURONTIN) 300 MG capsule Take 300 mg by mouth as needed (pain). 07/31/16   [provider]  levofloxacin  (LEVAQUIN ) 750 MG tablet Take 1 tablet (750 mg total) by mouth daily. 06/29/22   Bernard Drivers, MD  losartan -hydrochlorothiazide  (HYZAAR) 100-25 MG per tablet Take 1 tablet by mouth daily.    [provider]  metFORMIN  (GLUCOPHAGE -XR) 500 MG 24 hr tablet Take 1,000 mg by mouth in the morning and at bedtime.  09/23/18   [provider]  Multiple Vitamin (MULTIVITAMIN WITH MINERALS) TABS tablet Take 1 tablet by mouth daily.    [provider]  omeprazole (PRILOSEC) 20 MG capsule Take 20 mg by mouth daily as needed (acid reflux).    [provider]  [facility] VERIO test strip  09/12/18   [provider]  pregabalin  (LYRICA ) 75 MG capsule Take 1 capsule (75 mg total) by mouth 2 (two) times daily. 07/05/15   Ines Onetha NOVAK, MD  rosuvastatin  (CRESTOR ) 5 MG tablet Take 5 mg by mouth daily.  09/12/18   [provider]  tiZANidine  (ZANAFLEX ) 2 MG tablet Take 1 tablet (2 mg total) by mouth every 6 (six) hours as needed for muscle spasms. 06/05/18   Dreama Longs, MD  Vitamin D,  Ergocalciferol, (DRISDOL) 1.25 MG (50000 UT) CAPS capsule Take 50,000 Units by mouth every 7 (seven) days.  08/13/16   [provider]    Allergies: Patient has no known allergies.    Review of Systems  Updated Vital Signs BP (!) 165/68   Pulse (!) 49   Temp 98.7 F (37.1 C) (Oral)   Resp (!) 23   Ht 5' 7.5 (1.715 m)   Wt 86.6 kg   SpO2 97%   BMI 29.47 kg/m   Physical Exam Vitals and nursing note reviewed.  Constitutional:      General: She is not in acute distress.    Appearance: She is well-developed. She is not diaphoretic.  HENT:     Head: Normocephalic and atraumatic.   Eyes:     Conjunctiva/sclera: Conjunctivae normal.    Cardiovascular:     Rate and Rhythm: Normal rate and regular rhythm.     Heart sounds: Normal heart sounds. No murmur heard.    No friction rub. No gallop.  Pulmonary:     Effort: Pulmonary effort is normal. No respiratory distress.     Breath sounds: Normal breath sounds. No wheezing or rales.  Abdominal:     General: There is no distension.     Palpations: Abdomen is soft.     Tenderness: There is no abdominal tenderness. There is no guarding.   Musculoskeletal:        General: No tenderness.     Cervical back: Normal range of motion.     Right lower leg: No edema.     Left lower leg: No edema.   Skin:    General: Skin is warm and dry.     Findings: No erythema or rash.   Neurological:     Mental Status: She is alert and oriented to person, place, and time.     (all labs ordered are listed, but only abnormal results are displayed) Labs Reviewed  CBC WITH DIFFERENTIAL/PLATELET - Abnormal; Notable for the following components:      Result Value   Hemoglobin 11.7 (*)    All other components within normal limits  COMPREHENSIVE METABOLIC PANEL WITH GFR - Abnormal; Notable for the following components:   Glucose, Bld 394 (*)    All other components within normal limits  RESP PANEL BY RT-PCR (RSV, FLU A&B, COVID)  [id number]   PRO BRAIN NATRIURETIC PEPTIDE  TROPONIN T, HIGH SENSITIVITY  TROPONIN T, HIGH SENSITIVITY    EKG: EKG Interpretation Date/Time:  Saturday January 11 2024 10:30:02 EDT Ventricular Rate:  73 PR Interval:  130 QRS Duration:  94 QT Interval:  381 QTC Calculation: 420 R Axis:   63  Text Interpretation: Sinus rhythm Nonspecific repol abnormality, diffuse leads No significant  change since last tracing Confirmed by Dreama Longs ([id number]) on 01/11/2024 11:04:15 AM  Radiology: CT Angio Chest PE W and/or Wo Contrast Result Date: 01/11/2024 CLINICAL DATA:  One-week history of dyspnea and cough EXAM: CT ANGIOGRAPHY CHEST WITH CONTRAST TECHNIQUE: Multidetector CT imaging of the chest was performed using the standard protocol during bolus administration of intravenous contrast. Multiplanar CT image reconstructions and MIPs were obtained to evaluate the vascular anatomy. RADIATION DOSE REDUCTION: This exam was performed according to the departmental dose-optimization program which includes automated exposure control, adjustment of the mA and/or kV according to patient size and/or use of iterative reconstruction technique. CONTRAST:  75mL OMNIPAQUE  IOHEXOL  350 MG/ML SOLN COMPARISON:  Same day chest radiograph, CTA chest dated 02/10/2022 FINDINGS: Cardiovascular: The study is high quality for the evaluation of pulmonary embolism. There are no filling defects in the central, lobar, segmental or subsegmental pulmonary artery branches to suggest acute pulmonary embolism. Anatomic variant common origin of the brachiocephalic and common carotid arteries. Normal heart size. No significant pericardial fluid/thickening. Mediastinum/Nodes: Imaged thyroid gland without nodules meeting criteria for imaging follow-up by size. Normal esophagus. Mild multi station lymphadenopathy, for example 10 mm AP window lymph node (26:42), 10 mm precarinal (26:44), and 11 mm right hilar (26:54). Lungs/Pleura: The central airways are patent.  No focal consolidation. No pneumothorax. No pleural effusion. Upper abdomen: Normal. Musculoskeletal: No acute or abnormal lytic or blastic osseous lesions. Review of the MIP images confirms the above findings. IMPRESSION: 1. No evidence of pulmonary embolism or other acute intrathoracic process. 2. Mild multi station lymphadenopathy, nonspecific, possibly reactive. Electronically Signed   By: Limin  Xu M.D.   On: 01/11/2024 12:16   DG Chest Portable 1 View Result Date: 01/11/2024 EXAM: 1 VIEW XRAY OF THE CHEST 01/11/2024 10:49:00 AM COMPARISON: 1 view chest x-ray 12/18/2022. CLINICAL HISTORY: SOB and cough for a week. FINDINGS: LUNGS AND PLEURA: No focal pulmonary opacity. No pulmonary edema. No pleural effusion. No pneumothorax. HEART AND MEDIASTINUM: No acute abnormality of the cardiac and mediastinal silhouettes. BONES AND SOFT TISSUES: No acute osseous abnormality. IMPRESSION: 1. No acute process. Electronically signed by: Lonni Necessary MD 01/11/2024 10:52 AM EDT RP Workstation: [id number]     Procedures   Medications Ordered in the ED  nitroGLYCERIN  (NITROSTAT ) SL tablet 0.4 mg (0.4 mg Sublingual Given 01/11/24 1101)  acetaminophen  (TYLENOL ) tablet 1,000 mg (1,000 mg Oral Given 01/11/24 1104)  iohexol  (OMNIPAQUE ) 350 MG/ML injection 75 mL (75 mLs Intravenous Contrast Given 01/11/24 1126)  dexamethasone  (DECADRON ) tablet 10 mg (10 mg Oral Given 01/11/24 1358)                                      68 year old female with a history of high cholesterol, diabetes, hypertension, history of Chiari malformation, who presents with concern for shortness of breath.  Differential diagnosis for dyspnea includes ACS, PE, COPD exacerbation, CHF exacerbation, anemia, pneumonia, viral etiology such as COVID 19 infection, metabolic abnormality.    Chest x-ray was done and evaluated by me and radiology which showed no acute findings, no clear signs of pneumonia or pulmonary edema.  EKG was evaluated  by me which showed nonspecific changes similar to prior.    She not taking her blood pressure medication today, given hypertension and dyspnea on arrival, she was given nitroglycerin  SL.   Labs obtained and personally evaluated interpreted by me shows mild anemia, no leukocytosis, elevated glucose without signs  of DKA or other clinically significant electrolyte abnormalities, normal proBNP, normal troponin, COVID, influenza and RSV were tested given presence of cough showed no evidence of these infections.  CT PE study completed given dyspnea with no clear abnormality on chest x-ray, concern for possible occult pneumonia or pulmonary embolus and shows no evidence of PE or other intrathoracic process, mild mild multistation lymphadenopathy, nonspecific and possibly reactive.  Unclear etiology of symptoms--possible viral infection with reactive airway disease.  BP elevated on arrival however improved to 160s and were in the setting of not taking her bp medications at home and do not feel history or exam consistent with hypertensive emergency.  Will give dose of decadron  and discuss risks of hyperglycemia, recommend continued prn use of albuterol  and follow up with PCP. Unclear if lymph nodes reactive from viral process or represent another process--recommend follow up with PCP.       Final diagnoses:  Dyspnea, unspecified type  Acute cough    ED Discharge Orders          Ordered    albuterol  (PROVENTIL ) (2.5 MG/3ML) 0.083% nebulizer solution  Every 6 hours PRN        01/11/24 1329    benzonatate  (TESSALON ) 100 MG capsule  Every 8 hours        01/11/24 1329               Dreama Longs, MD 01/11/24 1701

## 2024-01-11 NOTE — ED Notes (Signed)
 ED Provider at bedside.

## 2024-01-11 NOTE — ED Notes (Signed)
 Pt at CT

## 2024-01-11 NOTE — ED Triage Notes (Signed)
 States, I am having labored breathing for the past week and it is getting worse Dry, cough x 1 week as well

## 2024-01-19 ENCOUNTER — Ambulatory Visit (HOSPITAL_BASED_OUTPATIENT_CLINIC_OR_DEPARTMENT_OTHER)
Admission: RE | Admit: 2024-01-19 | Discharge: 2024-01-19 | Disposition: A | Discharge status: Home / Self Care | Source: Ambulatory Visit | Attending: Neurosurgery | Admitting: Neurosurgery

## 2024-01-19 DIAGNOSIS — G935 Compression of brain: Secondary | ICD-10-CM | POA: Insufficient documentation

## 2024-07-20 ENCOUNTER — Encounter (HOSPITAL_BASED_OUTPATIENT_CLINIC_OR_DEPARTMENT_OTHER): Payer: Self-pay

## 2024-07-20 ENCOUNTER — Other Ambulatory Visit: Payer: Self-pay

## 2024-07-20 ENCOUNTER — Emergency Department (HOSPITAL_BASED_OUTPATIENT_CLINIC_OR_DEPARTMENT_OTHER)
Admission: EM | Admit: 2024-07-20 | Discharge: 2024-07-20 | Disposition: A | Discharge status: Home / Self Care | Attending: Emergency Medicine | Admitting: Emergency Medicine

## 2024-07-20 ENCOUNTER — Emergency Department (HOSPITAL_BASED_OUTPATIENT_CLINIC_OR_DEPARTMENT_OTHER)

## 2024-07-20 DIAGNOSIS — Z7984 Long term (current) use of oral hypoglycemic drugs: Secondary | ICD-10-CM | POA: Insufficient documentation

## 2024-07-20 DIAGNOSIS — Z7982 Long term (current) use of aspirin: Secondary | ICD-10-CM | POA: Diagnosis not present

## 2024-07-20 DIAGNOSIS — Z79899 Other long term (current) drug therapy: Secondary | ICD-10-CM | POA: Diagnosis not present

## 2024-07-20 DIAGNOSIS — E119 Type 2 diabetes mellitus without complications: Secondary | ICD-10-CM | POA: Diagnosis not present

## 2024-07-20 DIAGNOSIS — N390 Urinary tract infection, site not specified: Secondary | ICD-10-CM | POA: Diagnosis not present

## 2024-07-20 DIAGNOSIS — I10 Essential (primary) hypertension: Secondary | ICD-10-CM | POA: Diagnosis not present

## 2024-07-20 DIAGNOSIS — R1031 Right lower quadrant pain: Secondary | ICD-10-CM

## 2024-07-20 LAB — URINALYSIS, W/ REFLEX TO CULTURE (INFECTION SUSPECTED)
Bilirubin Urine: NEGATIVE
Glucose, UA: NEGATIVE mg/dL
Ketones, ur: NEGATIVE mg/dL
Leukocytes,Ua: NEGATIVE
Nitrite: NEGATIVE
Protein, ur: NEGATIVE mg/dL
Specific Gravity, Urine: >=1.03 (ref 1.005–1.030)
pH: 6 (ref 5.0–8.0)

## 2024-07-20 LAB — COMPREHENSIVE METABOLIC PANEL WITH GFR
ALT: 15 U/L (ref 0–44)
AST: 22 U/L (ref 15–41)
Albumin: 4.7 g/dL (ref 3.5–5.0)
Alkaline Phosphatase: 83 U/L (ref 38–126)
Anion gap: 11 (ref 5–15)
BUN: 12 mg/dL (ref 8–23)
CO2: 27 mmol/L (ref 22–32)
Calcium: 9.9 mg/dL (ref 8.9–10.3)
Chloride: 103 mmol/L (ref 98–111)
Creatinine, Ser: 0.74 mg/dL (ref 0.44–1.00)
GFR, Estimated: >60 mL/min
Glucose, Bld: 154 mg/dL — ABNORMAL HIGH (ref 70–99)
Potassium: 3.8 mmol/L (ref 3.5–5.1)
Sodium: 140 mmol/L (ref 135–145)
Total Bilirubin: 0.5 mg/dL (ref 0.0–1.2)
Total Protein: 7.9 g/dL (ref 6.5–8.1)

## 2024-07-20 LAB — CBC WITH DIFFERENTIAL/PLATELET
Abs Immature Granulocytes: 0.01 K/uL (ref 0.00–0.07)
Basophils Absolute: 0 K/uL (ref 0.0–0.1)
Basophils Relative: 1 %
Eosinophils Absolute: 0.1 K/uL (ref 0.0–0.5)
Eosinophils Relative: 2 %
HCT: 40.8 % (ref 36.0–46.0)
Hemoglobin: 13.1 g/dL (ref 12.0–15.0)
Immature Granulocytes: 0 %
Lymphocytes Relative: 42 %
Lymphs Abs: 1.8 K/uL (ref 0.7–4.0)
MCH: 25.9 pg — ABNORMAL LOW (ref 26.0–34.0)
MCHC: 32.1 g/dL (ref 30.0–36.0)
MCV: 80.6 fL (ref 80.0–100.0)
Monocytes Absolute: 0.3 K/uL (ref 0.1–1.0)
Monocytes Relative: 8 %
Neutro Abs: 2.1 K/uL (ref 1.7–7.7)
Neutrophils Relative %: 47 %
Platelets: 258 K/uL (ref 150–400)
RBC: 5.06 MIL/uL (ref 3.87–5.11)
RDW: 14.7 % (ref 11.5–15.5)
WBC: 4.4 K/uL (ref 4.0–10.5)
nRBC: 0 % (ref 0.0–0.2)

## 2024-07-20 LAB — LIPASE, BLOOD: Lipase: 27 U/L (ref 11–51)

## 2024-07-20 MED ORDER — IOHEXOL 300 MG/ML  SOLN
75.0000 mL | Freq: Once | INTRAMUSCULAR | Status: AC | PRN
  Administered 2024-07-20: 75 mL via INTRAVENOUS

## 2024-07-20 MED ORDER — CEPHALEXIN 500 MG PO CAPS: 500.0000 mg | ORAL_CAPSULE | Freq: Three times a day (TID) | ORAL | 0 refills | Status: AC

## 2024-07-20 MED ORDER — DICYCLOMINE HCL 10 MG/ML IM SOLN
20.0000 mg | Freq: Once | INTRAMUSCULAR | Status: AC
  Administered 2024-07-20: 20 mg via INTRAMUSCULAR
  Filled 2024-07-20: qty 2

## 2024-07-20 NOTE — ED Notes (Signed)
 Patient transported to CT

## 2024-07-20 NOTE — ED Triage Notes (Signed)
 Pt with lower right quadrant pain, went to [facility] and was sent for a CT with contrast, concern for appendicitis. Pain ongoing for 2 days, radiates down to pelvic area. Denies n/v.

## 2024-07-20 NOTE — Progress Notes (Signed)
 Payor: [employer] MEDICARE COMPLETE CHOICE [place] / Plan: [employer] MEDICARE COMPLETE CHOICE [place] / Product Type: Medicare Advantage /   History of Present Illness   History of Present Illness The patient is a 68 year old female who presents for evaluation of right lower quadrant pain.  She has been experiencing intermittent, stabbing pain in the right lower quadrant for the past 2 to 3 days. The pain, which she describes as sudden and sharp, lasts for approximately one minute before subsiding. It then recurs and radiates towards the groin. She reports no associated symptoms such as nausea, vomiting, fever, chills, dysuria, or hematuria. She also reports constipation, with her last bowel movement occurring yesterday, characterized by hard, clumped stools. She has not undergone any recent changes in her medication regimen. She has attempted to manage the pain with Tylenol , which has provided some relief.  Denies experiencing similar symptoms in the past.  States that she felt every bump in the road and her abdomen on her way to the clinic today.   Review of Systems  Gastrointestinal:        Right lower quadrant abdominal pain  All other systems reviewed and are negative.   Past Contributory History   Current Medications[1] Allergies[2] Medical History[3] Surgical History[4] Family History[5] Social Connections: Not on file    Physical Exam   Vitals:   07/20/24 1336  BP: 145/74  Pulse: 64  Temp: 98.2 F (36.8 C)  SpO2: 97%  Weight: 79.9 kg (176 lb 3.2 oz)  Height: 1.702 m (5' 7)   Physical Exam Vitals and nursing note reviewed.  Constitutional:      General: She is awake. She is not in acute distress.    Appearance: Normal appearance. She is not ill-appearing, toxic-appearing or diaphoretic.  HENT:     Head: Normocephalic and atraumatic.     Jaw: No trismus.     Right Ear: External ear normal.     Left Ear: External ear normal.     Nose: Nose normal.     Mouth/Throat:     Mouth: Mucous  membranes are moist.     Pharynx: Oropharynx is clear.  Eyes:     Pupils: Pupils are equal, round, and reactive to light.  Cardiovascular:     Rate and Rhythm: Normal rate and regular rhythm.  Pulmonary:     Effort: Pulmonary effort is normal. No respiratory distress.     Breath sounds: Normal breath sounds. No stridor. No wheezing, rhonchi or rales.  Chest:     Chest wall: No tenderness.  Abdominal:     General: Abdomen is flat. Bowel sounds are normal.     Palpations: Abdomen is soft.     Tenderness: There is abdominal tenderness in the right lower quadrant. There is no right CVA tenderness, left CVA tenderness, guarding or rebound. Positive signs include McBurney's sign. Negative signs include Murphy's sign, Rovsing's sign, psoas sign and obturator sign.     Comments: Positive heel tap  Musculoskeletal:     Cervical back: Normal range of motion and neck supple.  Neurological:     General: No focal deficit present.     Mental Status: She is alert and oriented to person, place, and time.  Psychiatric:        Behavior: Behavior is cooperative.     Procedures   Procedures  Results   Lab results: Recent Results (from the past 24 hours)  POC Urinalysis Auto without Microscopic   Collection Time: 07/20/24  2:06 PM  Result  Value Ref Range   Color, Urine Yellow Yellow   Clarity, Urine Clear Clear   Glucose, Urine Negative Negative mg/dL   Bilirubin, Urine Negative Negative   Ketones, Urine Negative Negative mg/dL   Specific Gravity, Urine 1.020 1.010, 1.015, 1.020, 1.025   Blood, Urine Negative Negative   pH, Urine 6.0 5.0, 5.5, 6.0, 6.5, 7.0, 7.5, 8.0   Protein, Urine 30 (A) Negative mg/dL   Urobilinogen, Urine 0.2 <2.0 mg/dL   Nitrite, Urine Negative Negative   Leukocyte Esterase, Urine Negative Negative   Kit/Device Lot # [id number]    Kit/Device Expiration Date 05/22/2025     X-ray results: Radiology Results (last 7 days)     ** No results found for the last 168  hours. **       Diagnosis & Disposition   1. RLQ abdominal pain  POC Urinalysis Auto without Microscopic       Assessment & Plan 1. Right lower quadrant abdominal pain: - The pain is intermittent, stabbing, and radiates towards the groin. There is no nausea, vomiting, fever, chills, burning during urination, or blood in the urine. - Physical examination reveals tenderness in the right lower quadrant with positive McBurney's point tenderness and positive heeltap. From the physical exam findings and chief complaint, I feel that the patient would best be worked up/evaluated in an emergency setting at this time. The patient was advised of the recommendation for transfer to the Emergency Department for further evaluation of their condition and symptoms.  Concern for possible appendicitis  -They had the opportunity to ask questions about their medical condition and they are aware of the potential medical consequences of refusing further care, including worsening of their condition and potential permanent disability or death.  -The patient agrees to the transfer via private vehicle.  She understands to remain n.p.o. until evaluated in the emergency department.   Counseled regarding condition(s) and all patient questions answered. If a new prescription was given today, then I discussed potential side effects, drug interactions, and instructions for taking the medication. Reviewed worrisome signs and symptoms to watch for and instructed patient to seek medical attention for any worsening, prolonged, changing, or new symptoms. I have discussed the signs and symptoms that would warrant proceeding to an emergency department.  All questions have been answered and the patient has voiced understanding and agreement with the plan of care.   Return for Report to the ED for further workup and evaluation.  There are no Patient Instructions on file for this visit.   This documented history was created with  the aid of Dragon dictation software as well as [employer], an automated transcribing artificial intelligence program, with light post-production editing.      [1] Current Outpatient Medications  Medication Sig Dispense Refill   albuterol  HFA (PROVENTIL  HFA;VENTOLIN  HFA;PROAIR  HFA) 90 mcg/actuation inhaler      amLODIPine  (NORVASC ) 10 mg tablet Take 10 mg by mouth daily.     aspirin  81 mg EC tablet Take 81 mg by mouth daily. (Patient taking differently: Take 81 mg by mouth daily. Every other day per pt, starting to hurt stomach)     ergocalciferol (VITAMIN D2) 1,250 mcg (50,000 unit) capsule Take 1 capsule by mouth as needed.     fluticasone propionate (FLONASE) 50 mcg/spray nasal spray Administer 2 sprays into each nostril daily. 15.8 mL 2   losartan -hydroCHLOROthiazide  (HYZAAR) 100-25 mg per tablet Take 1 tablet by mouth daily.     magnesium oxide (MAG-OX) 250 mg tablet Take  1 tablet by mouth daily.     metFORMIN  (GLUCOPHAGE ) 500 mg tablet Take 500 mg by mouth 2 (two) times a day with meals. 180 tablet 3   Mounjaro 2.5 mg/0.5 mL subcutaneous pen injector  (Patient taking differently: Inject 5 mg under the skin every 7 days.)     multivitamin with minerals (Daily Multivitamin-Minerals) tab Take 1 tablet by mouth daily.     omeprazole (PriLOSEC) 40 mg DR capsule Take 1 capsule (40 mg total) by mouth before breakfast and before evening meal. 180 capsule 3   rosuvastatin  (CRESTOR ) 5 mg tablet      diclofenac  (VOLTAREN ) 75 mg EC tablet Take 75 mg by mouth as needed. (Patient not taking: Reported on 07/20/2024)     No current facility-administered medications for this visit.  [2] No Known Allergies [3] Past Medical History: Diagnosis Date   Abnormal Pap smear of cervix    Adenopathy    Arthritis    Chest pain    Chiari I malformation    (CMD)    Diabetes mellitus (CMD)    Dizziness    Headache    Hypertension    Joint pain    Light headedness    Lower back  pain    Neck pain    Neck stiffness    OSA (obstructive sleep apnea) 03/07/2022   HST, AHI 6.9   Sarcoid    Vision abnormalities   [4] Past Surgical History: Procedure Laterality Date   BACK SURGERY  2010   Procedure: BACK SURGERY   BREAST BIOPSY Right 01/2013   Procedure: BREAST BIOPSY; bg   HYSTERECTOMY      Procedure: HYSTERECTOMY; partial at age 87   MYOMECTOMY  35  [5] Family History Problem Relation Name Age of Onset   Heart attack Mother Olena    Hypertension Mother Vera    Heart attack Maternal Grandmother     Heart attack Maternal Grandfather     Heart attack Other Mat aunt    Breast cancer Other Mat aunt    Breast cancer Other Mat cousin        72's   Breast cancer Other Mat cousin        30's   Heart attack Father Montgomery    Cancer Father Montgomery    Diabetes Father Montgomery

## 2024-07-20 NOTE — ED Provider Notes (Signed)
 " [facility] Provider Note   CSN: [id number] Arrival date & time: 07/20/24  1511     Patient presents with: Abdominal Pain   Martha [facility] is a 68 y.o. female.  {Add pertinent medical, surgical, social history, OB history to HPI:32947} HPI     3 days RLQ and to groin area Shapping sharp pain, then improve, coming and going No nausea, vomiting, diarrhea Has had constipation Had not noticed pain worse with straining No fever Appetite ok No dysuria or hematuria No vaginal bleeding or discharge   Past Medical History:  Diagnosis Date   Acid reflux    Back pain    Diabetes mellitus    High cholesterol    History of Chiari malformation    Hypertension    Irregular heart beats    occassional   Murmur     Past Surgical History:  Procedure Laterality Date   ABDOMINAL HYSTERECTOMY  1993   partial   BACK SURGERY  2009    Prior to Admission medications  Medication Sig Start Date End Date Taking? Authorizing Provider  acetaminophen  (TYLENOL ) 500 MG tablet Take 500 mg by mouth as needed. For pain    [provider]  albuterol  (PROVENTIL ) (2.5 MG/3ML) 0.083% nebulizer solution Take 3 mLs (2.5 mg total) by nebulization every 6 (six) hours as needed for wheezing or shortness of breath. 01/11/24   Dreama Longs, MD  albuterol  (VENTOLIN  HFA) 108 (90 Base) MCG/ACT inhaler Inhale 2 puffs into the lungs 4 (four) times daily as needed for wheezing. 05/23/19   [provider]  amLODipine  (NORVASC ) 10 MG tablet Take 10 mg by mouth daily.    [provider]  aspirin  EC 81 MG tablet Take 81 mg by mouth.    [provider]  benzonatate  (TESSALON ) 100 MG capsule Take 1 capsule (100 mg total) by mouth every 8 (eight) hours. 01/11/24   Dreama Longs, MD  diclofenac  (VOLTAREN ) 75 MG EC tablet Take 1 tablet (75 mg total) by mouth 2 (two) times daily. 05/23/18   Magdalen Pasco RAMAN, DPM  diclofenac  (VOLTAREN ) 75  MG EC tablet Take 1 tablet (75 mg total) by mouth 2 (two) times daily. 09/26/18   Magdalen Pasco RAMAN, DPM  diclofenac  (VOLTAREN ) 75 MG EC tablet Take 1 tablet (75 mg total) by mouth 2 (two) times daily. 12/10/18   Magdalen Pasco RAMAN, DPM  Dulaglutide 0.75 MG/0.5ML SOPN Inject 0.75 mg into the skin once a week.     [provider]  gabapentin (NEURONTIN) 300 MG capsule Take 300 mg by mouth as needed (pain). 07/31/16   [provider]  levofloxacin  (LEVAQUIN ) 750 MG tablet Take 1 tablet (750 mg total) by mouth daily. 06/29/22   Bernard Drivers, MD  losartan -hydrochlorothiazide  (HYZAAR) 100-25 MG per tablet Take 1 tablet by mouth daily.    [provider]  metFORMIN  (GLUCOPHAGE -XR) 500 MG 24 hr tablet Take 1,000 mg by mouth in the morning and at bedtime.  09/23/18   [provider]  Multiple Vitamin (MULTIVITAMIN WITH MINERALS) TABS tablet Take 1 tablet by mouth daily.    [provider]  omeprazole (PRILOSEC) 20 MG capsule Take 20 mg by mouth daily as needed (acid reflux).    [provider]  [facility] VERIO test strip  09/12/18   [provider]  pregabalin  (LYRICA ) 75 MG capsule Take 1 capsule (75 mg total) by mouth 2 (two) times daily. 07/05/15   Ines Onetha NOVAK, MD  rosuvastatin  (CRESTOR ) 5 MG tablet Take 5 mg by mouth daily.  09/12/18   [provider]  tiZANidine  (ZANAFLEX ) 2 MG tablet Take 1 tablet (2 mg total) by mouth every 6 (six) hours as needed for muscle spasms. 06/05/18   Dreama Longs, MD  Vitamin D, Ergocalciferol, (DRISDOL) 1.25 MG (50000 UT) CAPS capsule Take 50,000 Units by mouth every 7 (seven) days.  08/13/16   [provider]    Allergies: Patient has no known allergies.    Review of Systems  Updated Vital Signs BP (!) 180/89 (BP Location: Right Arm)   Pulse 75   Temp 98 F (36.7 C)   Resp 16   Ht 5' 7 (1.702 m)   Wt 79.4 kg   SpO2 100%   BMI 27.41 kg/m   Physical Exam  (all labs ordered are  listed, but only abnormal results are displayed) Labs Reviewed  CBC WITH DIFFERENTIAL/PLATELET  COMPREHENSIVE METABOLIC PANEL WITH GFR  LIPASE, BLOOD  URINALYSIS, W/ REFLEX TO CULTURE (INFECTION SUSPECTED)    EKG: None  Radiology: No results found.  {Document cardiac monitor, telemetry assessment procedure when appropriate:32947} Procedures   Medications Ordered in the ED - No data to display    {Click here for ABCD2, HEART and other calculators REFRESH Note before signing:1}                              Medical Decision Making Amount and/or Complexity of Data Reviewed Labs: ordered. Radiology: ordered.  Risk Prescription drug management.   ***  {Document critical care time when appropriate  Document review of labs and clinical decision tools ie CHADS2VASC2, etc  Document your independent review of radiology images and any outside records  Document your discussion with family members, caretakers and with consultants  Document social determinants of health affecting pt's care  Document your decision making why or why not admission, treatments were needed:32947:::1}   Final diagnoses:  None    ED Discharge Orders     None        "
